# Patient Record
Sex: Female | Born: 1985 | Race: White | Hispanic: No | Marital: Married | Smoking: Former smoker
Health system: Southern US, Community
[De-identification: ages and names within clinical notes are randomized; demographics above are authoritative.]

## PROBLEM LIST (undated history)

## (undated) DIAGNOSIS — E559 Vitamin D deficiency, unspecified: Secondary | ICD-10-CM

## (undated) DIAGNOSIS — E538 Deficiency of other specified B group vitamins: Secondary | ICD-10-CM

## (undated) DIAGNOSIS — E063 Autoimmune thyroiditis: Secondary | ICD-10-CM

## (undated) HISTORY — DX: Deficiency of other specified B group vitamins: E53.8

## (undated) HISTORY — PX: WISDOM TOOTH EXTRACTION: SHX21

## (undated) HISTORY — DX: Vitamin D deficiency, unspecified: E55.9

---

## 2010-09-10 DIAGNOSIS — N921 Excessive and frequent menstruation with irregular cycle: Secondary | ICD-10-CM | POA: Insufficient documentation

## 2011-09-28 DIAGNOSIS — G619 Inflammatory polyneuropathy, unspecified: Secondary | ICD-10-CM | POA: Insufficient documentation

## 2011-12-15 DIAGNOSIS — E039 Hypothyroidism, unspecified: Secondary | ICD-10-CM | POA: Insufficient documentation

## 2014-09-09 DIAGNOSIS — E559 Vitamin D deficiency, unspecified: Secondary | ICD-10-CM | POA: Insufficient documentation

## 2014-11-19 DIAGNOSIS — Z789 Other specified health status: Secondary | ICD-10-CM | POA: Insufficient documentation

## 2015-01-28 DIAGNOSIS — K5900 Constipation, unspecified: Secondary | ICD-10-CM | POA: Insufficient documentation

## 2015-05-02 DIAGNOSIS — Z124 Encounter for screening for malignant neoplasm of cervix: Secondary | ICD-10-CM | POA: Insufficient documentation

## 2016-06-29 DIAGNOSIS — E063 Autoimmune thyroiditis: Secondary | ICD-10-CM | POA: Insufficient documentation

## 2016-09-28 DIAGNOSIS — J309 Allergic rhinitis, unspecified: Secondary | ICD-10-CM | POA: Insufficient documentation

## 2019-01-01 DIAGNOSIS — E042 Nontoxic multinodular goiter: Secondary | ICD-10-CM | POA: Insufficient documentation

## 2021-01-30 ENCOUNTER — Other Ambulatory Visit: Payer: Self-pay

## 2021-01-30 ENCOUNTER — Emergency Department
Admission: EM | Admit: 2021-01-30 | Discharge: 2021-01-30 | Disposition: A | Discharge status: Home / Self Care | Payer: BC Managed Care – PPO | Attending: Emergency Medicine | Admitting: Emergency Medicine

## 2021-01-30 ENCOUNTER — Emergency Department: Payer: BC Managed Care – PPO

## 2021-01-30 ENCOUNTER — Ambulatory Visit
Admission: RE | Admit: 2021-01-30 | Discharge: 2021-01-30 | Disposition: A | Discharge status: Home / Self Care | Payer: BC Managed Care – PPO | Source: Ambulatory Visit | Attending: Emergency Medicine | Admitting: Emergency Medicine

## 2021-01-30 ENCOUNTER — Encounter: Payer: Self-pay | Admitting: Emergency Medicine

## 2021-01-30 VITALS — BP 138/95 | HR 84 | Temp 99.3°F | Resp 16

## 2021-01-30 DIAGNOSIS — Z20822 Contact with and (suspected) exposure to covid-19: Secondary | ICD-10-CM | POA: Diagnosis not present

## 2021-01-30 DIAGNOSIS — R1030 Lower abdominal pain, unspecified: Secondary | ICD-10-CM

## 2021-01-30 DIAGNOSIS — Z87891 Personal history of nicotine dependence: Secondary | ICD-10-CM | POA: Diagnosis not present

## 2021-01-30 DIAGNOSIS — K5900 Constipation, unspecified: Secondary | ICD-10-CM | POA: Insufficient documentation

## 2021-01-30 DIAGNOSIS — R030 Elevated blood-pressure reading, without diagnosis of hypertension: Secondary | ICD-10-CM

## 2021-01-30 HISTORY — DX: Autoimmune thyroiditis: E06.3

## 2021-01-30 LAB — CBC
HCT: 41 % (ref 36.0–46.0)
Hemoglobin: 13.6 g/dL (ref 12.0–15.0)
MCH: 28.5 pg (ref 26.0–34.0)
MCHC: 33.2 g/dL (ref 30.0–36.0)
MCV: 85.8 fL (ref 80.0–100.0)
Platelets: 359 10*3/uL (ref 150–400)
RBC: 4.78 MIL/uL (ref 3.87–5.11)
RDW: 12.6 % (ref 11.5–15.5)
WBC: 9.2 10*3/uL (ref 4.0–10.5)
nRBC: 0 % (ref 0.0–0.2)

## 2021-01-30 LAB — COMPREHENSIVE METABOLIC PANEL
ALT: 9 U/L (ref 0–44)
AST: 18 U/L (ref 15–41)
Albumin: 4.5 g/dL (ref 3.5–5.0)
Alkaline Phosphatase: 37 U/L — ABNORMAL LOW (ref 38–126)
Anion gap: 7 (ref 5–15)
BUN: 10 mg/dL (ref 6–20)
CO2: 25 mmol/L (ref 22–32)
Calcium: 9.1 mg/dL (ref 8.9–10.3)
Chloride: 105 mmol/L (ref 98–111)
Creatinine, Ser: 0.72 mg/dL (ref 0.44–1.00)
GFR, Estimated: >60 mL/min (ref >60)
Glucose, Bld: 94 mg/dL (ref 70–99)
Potassium: 3.7 mmol/L (ref 3.5–5.1)
Sodium: 137 mmol/L (ref 135–145)
Total Bilirubin: 0.8 mg/dL (ref 0.3–1.2)
Total Protein: 7.9 g/dL (ref 6.5–8.1)

## 2021-01-30 LAB — RESP PANEL BY RT-PCR (FLU A&B, COVID) ARPGX2
Influenza A by PCR: NEGATIVE
Influenza B by PCR: NEGATIVE
SARS Coronavirus 2 by RT PCR: NEGATIVE

## 2021-01-30 LAB — POCT URINALYSIS DIP (MANUAL ENTRY)
Bilirubin, UA: NEGATIVE
Glucose, UA: NEGATIVE mg/dL
Ketones, POC UA: NEGATIVE mg/dL
Leukocytes, UA: NEGATIVE
Nitrite, UA: NEGATIVE
Protein Ur, POC: NEGATIVE mg/dL
Spec Grav, UA: 1.025 (ref 1.010–1.025)
Urobilinogen, UA: 0.2 E.U./dL
pH, UA: 7 (ref 5.0–8.0)

## 2021-01-30 LAB — LIPASE, BLOOD: Lipase: 27 U/L (ref 11–51)

## 2021-01-30 LAB — POCT URINE PREGNANCY: Preg Test, Ur: NEGATIVE

## 2021-01-30 IMAGING — CT CT ABD-PELV W/ CM
2 of 4 series · 17 of 46 positions shown, 19 images · IV contrast (omnipaque)
Comparison: None.

CLINICAL DATA: Right lower quadrant abdominal pain and bloating.

EXAM:
CT ABDOMEN AND PELVIS WITH CONTRAST
TECHNIQUE: Multidetector CT imaging of the abdomen and pelvis was performed
using the standard protocol following bolus administration of
intravenous contrast.
CONTRAST:  100mL OMNIPAQUE IOHEXOL 300 MG/ML  SOLN

[Series 2: routine abd/pel with · axial · 0.76mm/px · z∈[-893,-488]mm · 14 of 89 slices shown, 16 images]
[im 4/89  soft-tissue]
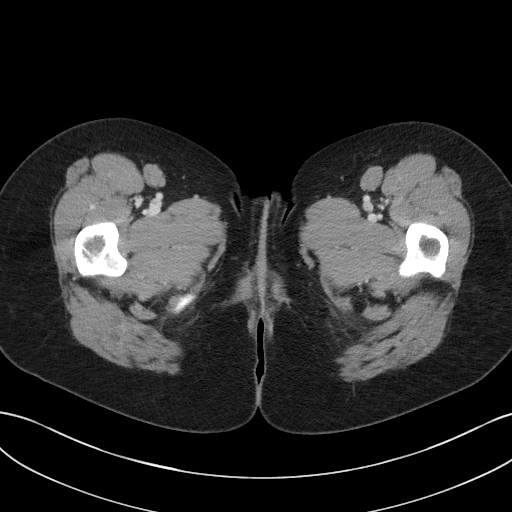
[im 4/89  bone]
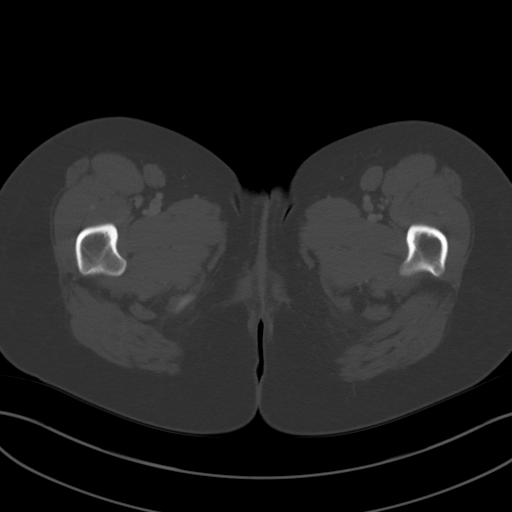
[im 11/89  soft-tissue]
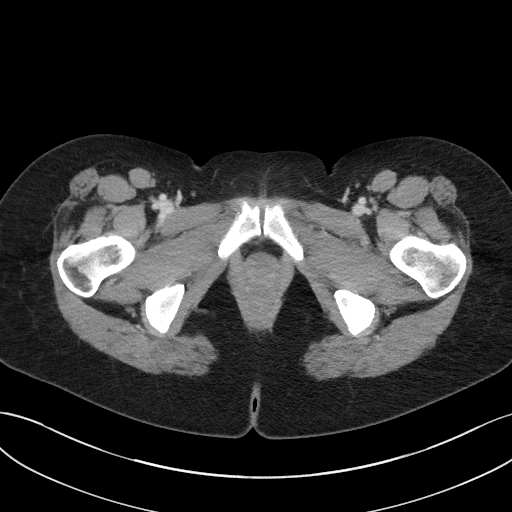
[im 18/89  soft-tissue]
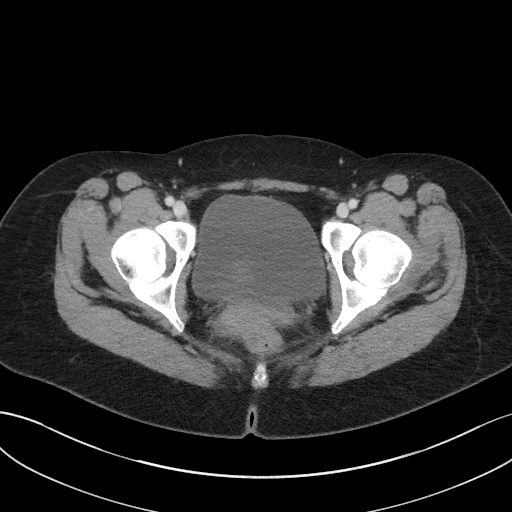
[im 25/89  soft-tissue]
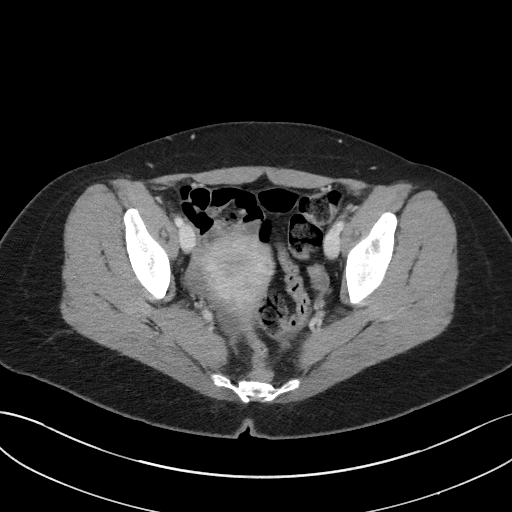
[im 29/89  soft-tissue]
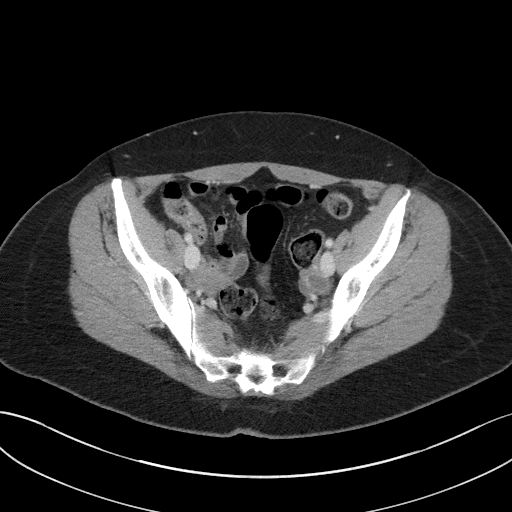
[im 36/89  soft-tissue]
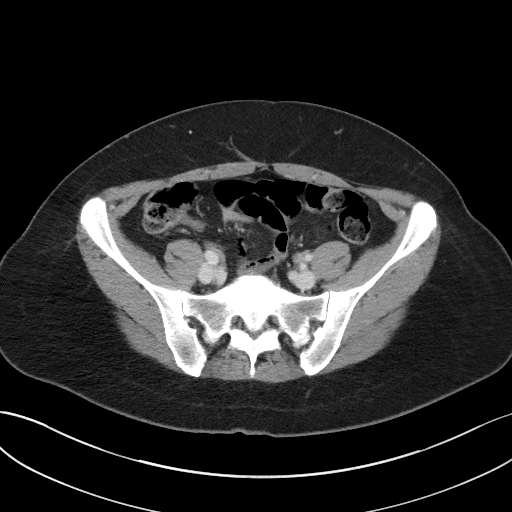
[im 43/89  soft-tissue]
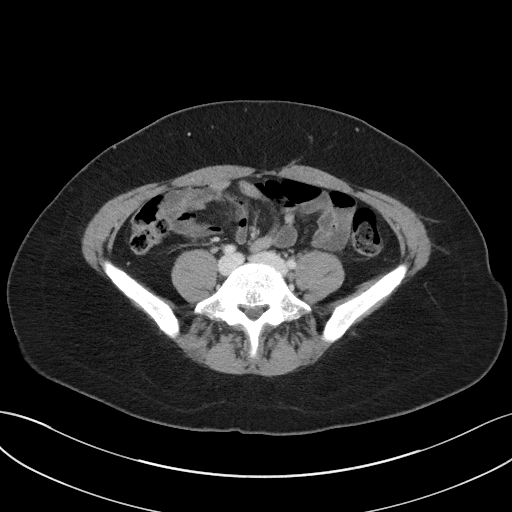
[im 46/89  soft-tissue]
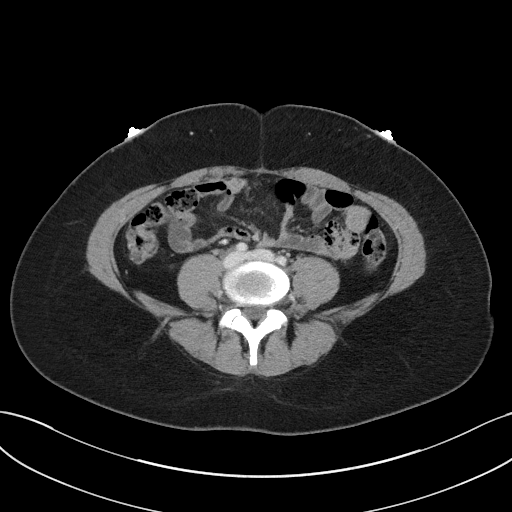
[im 53/89  soft-tissue]
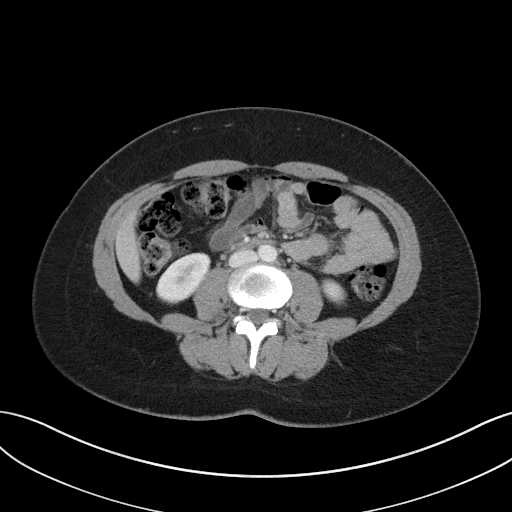
[im 53/89  bone]
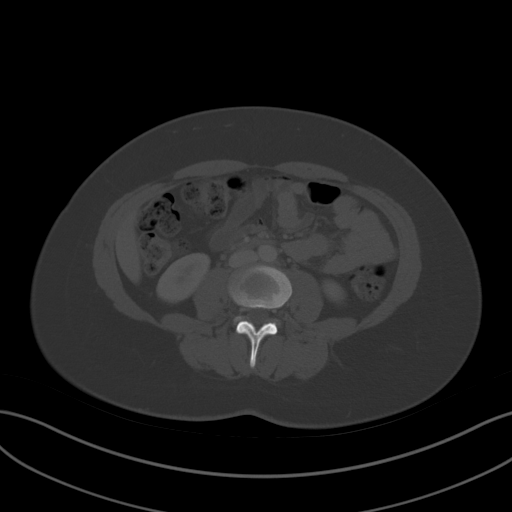
[im 60/89  soft-tissue]
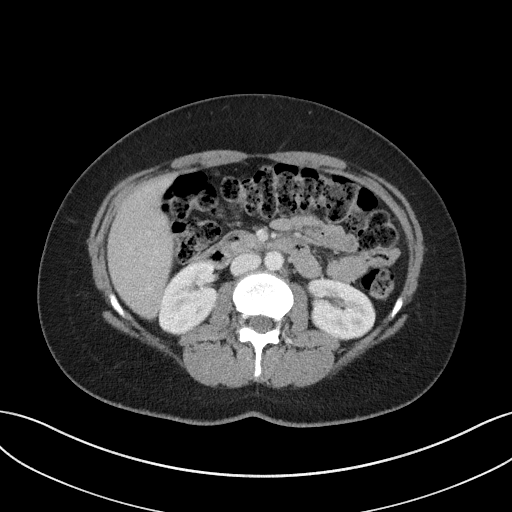
[im 67/89  soft-tissue]
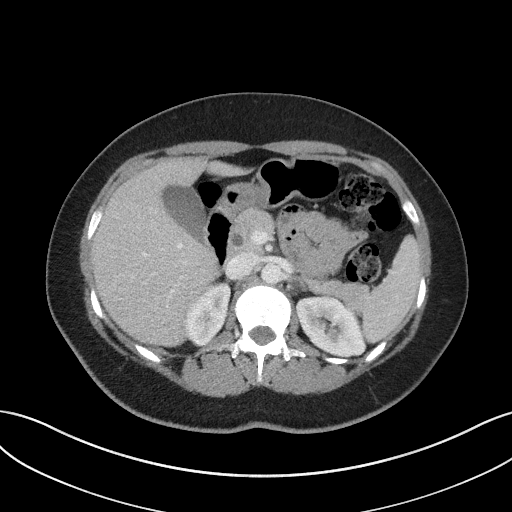
[im 71/89  soft-tissue]
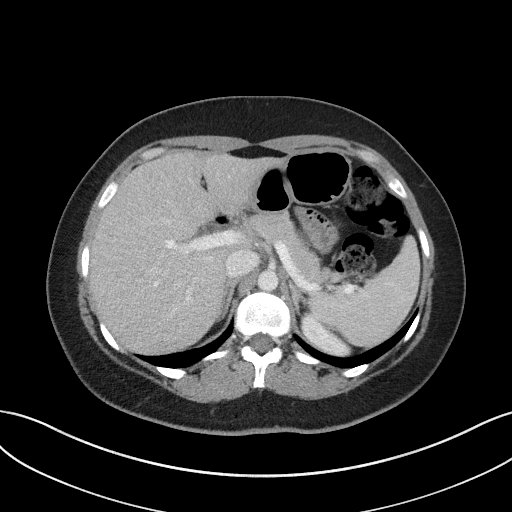
[im 78/89  soft-tissue]
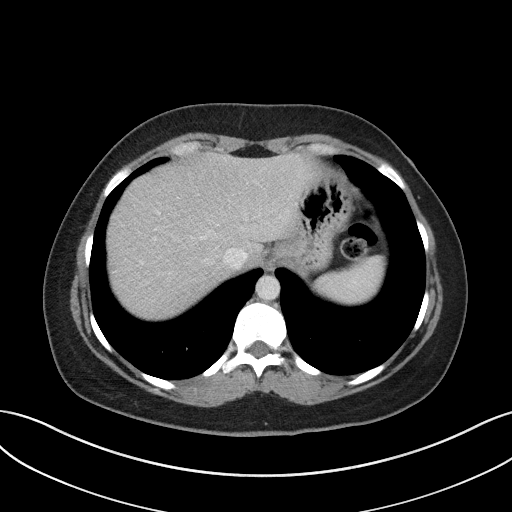
[im 85/89  soft-tissue]
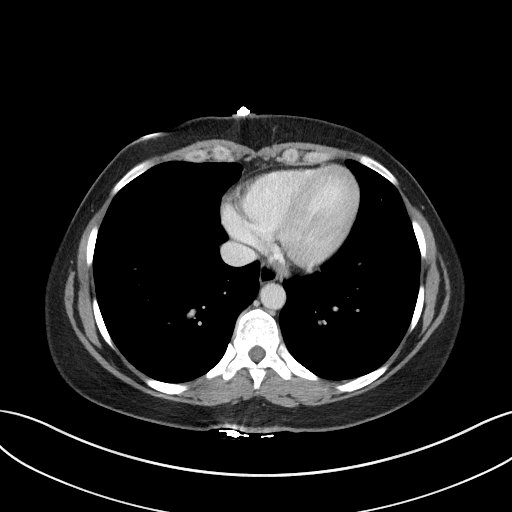

[Series 5: coronal st · coronal · 0.85mm/px · 3 of 82 slices shown]
[im 28/82  soft-tissue]
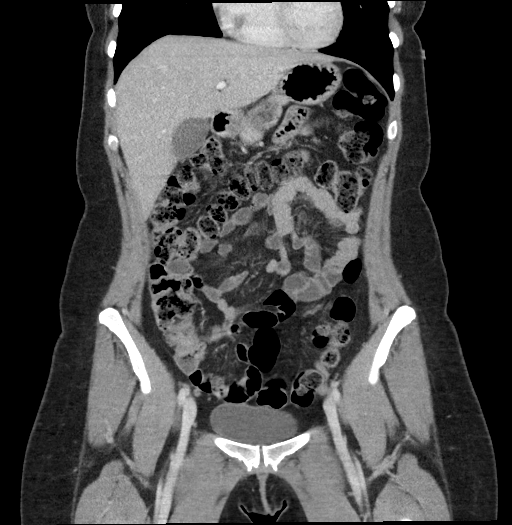
[im 37/82  soft-tissue]
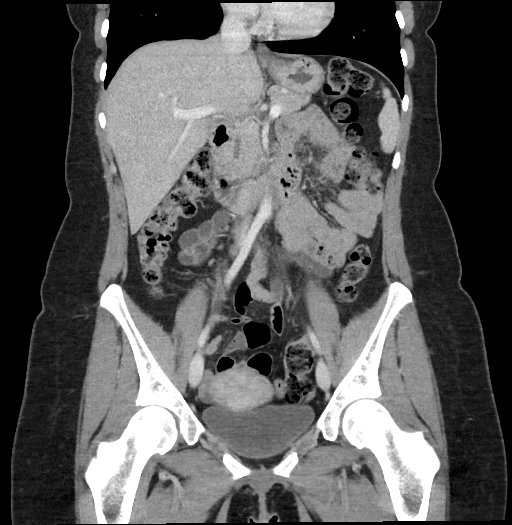
[im 46/82  soft-tissue]
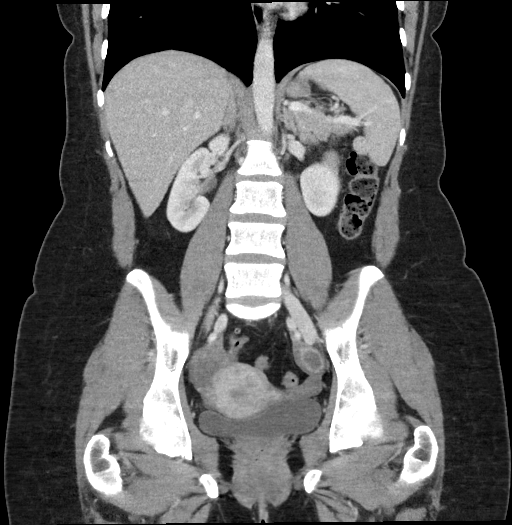

[17 of 46 positions shown; findings below may reference images not displayed]

FINDINGS: Lower chest: Unremarkable

Hepatobiliary: Mildly dilated common bile duct 0.7 cm in diameter.
No significant intrahepatic biliary duct dilatation. No sharply
defined calcification along the biliary tree. No focal hepatic
parenchymal lesion identified.

Pancreas: Unremarkable.  No pancreatic duct dilatation.

Spleen: Unremarkable

Adrenals/Urinary Tract: Sharply defined 0.8 cm hypodense lesion of
the left kidney lower pole is likely a cyst although technically too
small to characterize. Adrenal glands unremarkable. No
hydronephrosis, hydroureter, or urinary tract calculi identified.

Stomach/Bowel: Mild prominence of stool in the colon favoring mild
constipation. Normal appendix. No dilated small bowel.

Vascular/Lymphatic: Unremarkable

Reproductive: 1.6 by 1.1 cm rim enhancing lesion in the left ovary
is likely a corpus luteum. Endometrial thickness approximately
cm, within normal limits for patient age. Trace free pelvic fluid,
possibly physiologic but technically nonspecific.

Other: No supplemental non-categorized findings.

Musculoskeletal: Unremarkable
IMPRESSION: 1. Mild extrahepatic biliary dilatation, cause uncertain.
2. Mild prominence of stool in the colon favoring mild constipation.
3. Trace free pelvic fluid, possibly physiologic.

## 2021-01-30 MED ORDER — IOHEXOL 300 MG/ML  SOLN
100.0000 mL | Freq: Once | INTRAMUSCULAR | Status: AC | PRN
  Administered 2021-01-30: 100 mL via INTRAVENOUS

## 2021-01-30 NOTE — ED Triage Notes (Addendum)
Pt reports that she has abd pain in her lower quads bilat with bloating with pain between her shoulder blades and back pain. She went to urgent care and they did a ua on her. She reports nausea with dizziness. Denies diarrhea.

## 2021-01-30 NOTE — ED Provider Notes (Signed)
[facility]    CSN: [id number] Arrival date & time: 01/30/21  1356      History   Chief Complaint Chief Complaint  Patient presents with   Abdominal Pain     HPI Angela Guerrero is a 35 y.o. female.  Patient presents with 3 to 4-day history of acute lower abdominal pain.  No falls or trauma.  The pain is constant and comes in waves, currently 4/10, dull cramping aching.  Patient states she is concerned for ischemic bowel.  She denies fever, chills, vomiting, diarrhea, constipation, dysuria, hematuria, vaginal discharge, pelvic pain, or other symptoms.  No treatments attempted at home.  Her medical history includes Hashimoto's thyroiditis.  The history is provided by the patient.   Past Medical History:  Diagnosis Date   Hashimoto's thyroiditis     There are no problems to display for this patient.   Past Surgical History:  Procedure Laterality Date   WISDOM TOOTH EXTRACTION      OB History   No obstetric history on file.      Home Medications    Prior to Admission medications   Medication Sig Start Date End Date Taking? Authorizing Provider  levothyroxine (SYNTHROID) 112 MCG tablet Take 112 mcg by mouth daily before breakfast.   Yes [provider]    Family History History reviewed. No pertinent family history.  Social History Social History   Tobacco Use   Smoking status: Former    Years: 15.00    Pack years: 0.00    Types: Cigarettes    Quit date: 12/31/2020    Years since quitting: 0.0   Smokeless tobacco: Never  Vaping Use   Vaping Use: Never used  Substance Use Topics   Alcohol use: Yes    Comment: 3/wk   Drug use: Never     Allergies   Percocet [oxycodone-acetaminophen]   Review of Systems Review of Systems  Constitutional:  Negative for chills and fever.  Respiratory:  Negative for cough and shortness of breath.   Cardiovascular:  Negative for chest pain and palpitations.  Gastrointestinal:  Positive for abdominal  pain. Negative for diarrhea, nausea and vomiting.  Genitourinary:  Negative for dysuria, flank pain, hematuria, pelvic pain and vaginal discharge.  Skin:  Negative for color change and rash.  All other systems reviewed and are negative.   Physical Exam Triage Vital Signs ED Triage Vitals  Enc Vitals Group     BP      Pulse      Resp      Temp      Temp src      SpO2      Weight      Height      Head Circumference      Peak Flow      Pain Score      Pain Loc      Pain Edu?      Excl. in GC?    No data found.  Updated Vital Signs BP (!) 138/95 (BP Location: Left Arm)   Pulse 84   Temp 99.3 F (37.4 C) (Oral)   Resp 16   LMP 01/09/2021 (Exact Date)   SpO2 98%   Visual Acuity Right Eye Distance:   Left Eye Distance:   Bilateral Distance:    Right Eye Near:   Left Eye Near:    Bilateral Near:     Physical Exam Vitals and nursing note reviewed.  Constitutional:      General:  She is not in acute distress.    Appearance: She is well-developed. She is not ill-appearing.  HENT:     Head: Normocephalic and atraumatic.     Mouth/Throat:     Mouth: Mucous membranes are moist.  Eyes:     Conjunctiva/sclera: Conjunctivae normal.  Cardiovascular:     Rate and Rhythm: Normal rate and regular rhythm.     Heart sounds: Normal heart sounds.  Pulmonary:     Effort: Pulmonary effort is normal. No respiratory distress.     Breath sounds: Normal breath sounds.  Abdominal:     General: Bowel sounds are normal. There is no distension.     Palpations: Abdomen is soft.     Tenderness: There is abdominal tenderness in the suprapubic area. There is no right CVA tenderness, left CVA tenderness, guarding or rebound.     Comments: Mild tenderness to palpation of suprapubic area.  No rebound or guarding.  No CVAT.  Musculoskeletal:     Cervical back: Neck supple.  Skin:    General: Skin is warm and dry.  Neurological:     General: No focal deficit present.     Mental Status:  She is alert and oriented to person, place, and time.     Gait: Gait normal.  Psychiatric:        Mood and Affect: Mood normal.        Behavior: Behavior normal.     UC Treatments / Results  Labs (all labs ordered are listed, but only abnormal results are displayed) Labs Reviewed  POCT URINALYSIS DIP (MANUAL ENTRY) - Abnormal; Notable for the following components:      Result Value   Blood, UA trace-intact (*)    All other components within normal limits  POCT URINE PREGNANCY    EKG   Radiology No results found.  Procedures Procedures (including critical care time)  Medications Ordered in UC Medications - No data to display  Initial Impression / Assessment and Plan / UC Course  I have reviewed the triage vital signs and the nursing notes.  Pertinent labs & imaging results that were available during my care of the patient were reviewed by me and considered in my medical decision making (see chart for details).  Lower abdominal pain, elevated blood pressure reading.  Patient is mildly tender in her suprapubic area.  She is afebrile and her vital signs are stable.  She expresses concern that she may have ischemic bowel.  No history of bowel disease.  Discussed limitations of evaluation in an urgent care setting.  Sending her to the ED for evaluation.  Also discussed that her blood pressure is elevated today and needs to be rechecked by her PCP in 2 to 4 weeks.  She agrees to plan of care.   Final Clinical Impressions(s) / UC Diagnoses   Final diagnoses:  Lower abdominal pain  Elevated blood pressure reading     Discharge Instructions      Go to the emergency department for evaluation of your abdominal pain.  Your blood pressure is elevated today at 138/95.  Please have this rechecked by your primary care provider in 2-4 weeks.          ED Prescriptions   None    PDMP not reviewed this encounter.   Mickie Bail, NP 01/30/21 1451

## 2021-01-30 NOTE — ED Notes (Signed)
Patient resting in room 3.  VSS.  Respirations even and unlabored.  No acute distress noted.

## 2021-01-30 NOTE — Discharge Instructions (Addendum)
Go to the emergency department for evaluation of your abdominal pain.  Your blood pressure is elevated today at 138/95.  Please have this rechecked by your primary care provider in 2-4 weeks.

## 2021-01-30 NOTE — ED Provider Notes (Signed)
[facility] Emergency Department Provider Note  Time seen: 6:44 PM  I have reviewed the triage vital signs and the nursing notes.   HISTORY  Chief Complaint Abdominal Pain   HPI Angela Guerrero is a 35 y.o. female with no significant past medical history presents to the emergency department for lower abdominal pain.  According to the patient for the past 3 days she has been experiencing more vague abdominal pain moderate in severity now more concentrated pain in the lower abdomen.  Patient denies any dysuria or hematuria.  Denies any vaginal bleeding or discharge.  Patient is married and denies any possibility of STD.  Patient states subjective fever over the past 2 days as well but is never measured a temperature.  Denies any vomiting or diarrhea.   Past Medical History:  Diagnosis Date   Hashimoto's thyroiditis     There are no problems to display for this patient.   Past Surgical History:  Procedure Laterality Date   WISDOM TOOTH EXTRACTION      Prior to Admission medications   Medication Sig Start Date End Date Taking? Authorizing Provider  levothyroxine (SYNTHROID) 112 MCG tablet Take 112 mcg by mouth daily before breakfast.    [provider]    Allergies  Allergen Reactions   Percocet [Oxycodone-Acetaminophen] Diarrhea    No family history on file.  Social History Social History   Tobacco Use   Smoking status: Former    Years: 15.00    Pack years: 0.00    Types: Cigarettes    Quit date: 12/31/2020    Years since quitting: 0.0   Smokeless tobacco: Never  Vaping Use   Vaping Use: Never used  Substance Use Topics   Alcohol use: Yes    Comment: 3/wk   Drug use: Never    Review of Systems Constitutional: Subjective fever Cardiovascular: Negative for chest pain. Respiratory: Negative for shortness of breath. Gastrointestinal: Moderate central lower abdominal pain. Genitourinary: Negative for urinary  compaints Musculoskeletal: Negative for musculoskeletal complaints Skin: Negative for skin complaints  Neurological: Negative for headache All other ROS negative  ____________________________________________   PHYSICAL EXAM:  VITAL SIGNS: ED Triage Vitals  Enc Vitals Group     BP 01/30/21 1828 (!) 144/90     Pulse Rate 01/30/21 1828 71     Resp 01/30/21 1828 17     Temp 01/30/21 1828 98.3 F (36.8 C)     Temp Source 01/30/21 1828 Oral     SpO2 01/30/21 1828 99 %     Weight 01/30/21 1535 160 lb (72.6 kg)     Height 01/30/21 1535 5\' 2"  (1.575 m)     Head Circumference --      Peak Flow --      Pain Score 01/30/21 1534 4     Pain Loc --      Pain Edu? --      Excl. in GC? --    Constitutional: Alert and oriented. Well appearing and in no distress. Eyes: Normal exam ENT      Head: Normocephalic and atraumatic.      Mouth/Throat: Mucous membranes are moist. Cardiovascular: Normal rate, regular rhythm.  Respiratory: Normal respiratory effort without tachypnea nor retractions. Breath sounds are clear Gastrointestinal: Soft, mild suprapubic tenderness palpation without rebound guarding or distention.  Abdomen otherwise benign. Musculoskeletal: Nontender with normal range of motion in all extremities. Neurologic:  Normal speech and language. No gross focal neurologic deficits  Skin:  Skin is warm, dry  and intact.  Psychiatric: Mood and affect are normal. S  ____________________________________________    EKG  EKG viewed and interpreted by myself shows a normal sinus rhythm at 93 bpm with a narrow QRS, normal axis, normal intervals, no concerning ST changes.  ____________________________________________    RADIOLOGY  Mildly dilated biliary duct otherwise negative CT  ____________________________________________   INITIAL IMPRESSION / ASSESSMENT AND PLAN / ED COURSE  Pertinent labs & imaging results that were available during my care of the patient were reviewed by  me and considered in my medical decision making (see chart for details).   Patient presents to the emergency department for lower abdominal pain.  Patient states she has been experiencing pain over the past 3 days initially more vague across the abdomen and now more focal to the lower abdomen.  Patient states subjective fever at times as well.  Patient's lab work is largely within normal limits.  Urinalysis performed at urgent care and [facility] was negative.  We will proceed with CT scan to rule out appendicitis or other intra-abdominal pathology.  Differential would include appendicitis, ovarian cyst or hemorrhagic cyst, diverticulitis.  CT largely negative besides mild constipation.  Normal appendix.  Given the patient's reassuring work-up and negative CT I do believe the patient safe for discharge home.  I did discuss with the patient trial of MiraLAX.  Angela Guerrero was evaluated in Emergency Department on 01/30/2021 for the symptoms described in the history of present illness. She was evaluated in the context of the global COVID-19 pandemic, which necessitated consideration that the patient might be at risk for infection with the SARS-CoV-2 virus that causes COVID-19. Institutional protocols and algorithms that pertain to the evaluation of patients at risk for COVID-19 are in a state of rapid change based on information released by regulatory bodies including the CDC and federal and state organizations. These policies and algorithms were followed during the patient's care in the ED.  ____________________________________________   FINAL CLINICAL IMPRESSION(S) / ED DIAGNOSES  Lower abdominal pain   Minna Antis, MD 01/30/21 2015

## 2021-01-30 NOTE — Discharge Instructions (Addendum)
As we discussed your work-up is overall reassuring besides mild constipation.  Please use MiraLAX or Colace (over-the-counter) as written on the box.  Return to the emergency department for any fever, worsening abdominal pain, or any other symptom personally concerning to yourself.

## 2021-01-30 NOTE — ED Triage Notes (Signed)
Pt presents with abdominal pain, bloating, pain in between shoulder blades intermittent x years but has become constant and going to mid back.  Describes abdominal pain as dull, cramping pain that won't go away.  Was initially generalized abdominal pain now is lower. No changes in bowel movement.   Has not taken any meds at home.

## 2022-05-30 ENCOUNTER — Ambulatory Visit
Admission: RE | Admit: 2022-05-30 | Discharge: 2022-05-30 | Disposition: A | Discharge status: Home / Self Care | Payer: BC Managed Care – PPO | Source: Ambulatory Visit | Attending: Internal Medicine | Admitting: Internal Medicine

## 2022-05-30 VITALS — BP 136/92 | HR 91 | Temp 97.9°F | Resp 16

## 2022-05-30 DIAGNOSIS — J069 Acute upper respiratory infection, unspecified: Secondary | ICD-10-CM | POA: Insufficient documentation

## 2022-05-30 DIAGNOSIS — R519 Headache, unspecified: Secondary | ICD-10-CM | POA: Diagnosis not present

## 2022-05-30 DIAGNOSIS — Z1152 Encounter for screening for COVID-19: Secondary | ICD-10-CM | POA: Diagnosis not present

## 2022-05-30 LAB — RESP PANEL BY RT-PCR (RSV, FLU A&B, COVID)  RVPGX2
Influenza A by PCR: NEGATIVE
Influenza B by PCR: NEGATIVE
Resp Syncytial Virus by PCR: NEGATIVE
SARS Coronavirus 2 by RT PCR: NEGATIVE

## 2022-05-30 MED ORDER — DEXAMETHASONE SODIUM PHOSPHATE 10 MG/ML IJ SOLN
10.0000 mg | Freq: Once | INTRAMUSCULAR | Status: AC
  Administered 2022-05-30: 10 mg via INTRAMUSCULAR

## 2022-05-30 MED ORDER — KETOROLAC TROMETHAMINE 30 MG/ML IJ SOLN
30.0000 mg | Freq: Once | INTRAMUSCULAR | Status: AC
  Administered 2022-05-30: 30 mg via INTRAMUSCULAR

## 2022-05-30 NOTE — Discharge Instructions (Signed)
It appears that you have a viral upper respiratory infection that should self resolve with symptomatic treatment.  Your COVID test is pending.  We will call if it is positive.  I have given you 2 injections today to help alleviate headache and upper respiratory symptoms.  Please avoid taking any ibuprofen, advil, aleve for at least 24 hours following injection.

## 2022-05-30 NOTE — ED Provider Notes (Signed)
[facility]    CSN: [id number] Arrival date & time: 05/30/22  1309      History   Chief Complaint Chief Complaint  Patient presents with   Nasal Congestion    Day 5 of headache, sore throat, congestion etc. Would like to rule out influenza/covid prior to returning to work - Entered by patient   URI    HPI Angela Guerrero is a 36 y.o. female.   Patient presents with nasal congestion, sinus pressure, headache that started about 6 days ago.  Her husband currently has a cough.  She denies any known fevers at home.  Headache is present in the left side of the head.  Patient is attributing to headache to sinus pressure.  Denies any r recent falls or trauma to the head.  She does report history of migraines in childhood.  Denies cough, chest pain, shortness of breath, sore throat, ear pain, nausea, vomiting, diarrhea, abdominal pain.  Patient has taken over-the-counter medications for cold and flu with minimal improvement of symptoms.  Patient has taken ibuprofen early this morning with no improvement in headache.   URI   Past Medical History:  Diagnosis Date   Hashimoto's thyroiditis     Patient Active Problem List   Diagnosis Date Noted   Multinodular goiter 01/01/2019   Allergic rhinitis 09/28/2016   Hashimoto's thyroiditis 06/29/2016   Encounter for screening for malignant neoplasm of cervix 05/02/2015   Constipation 01/28/2015   Other specified health status 11/19/2014   Vitamin D deficiency 09/09/2014   Hypothyroidism 12/15/2011   Inflammatory polyneuropathy ([place]) 09/28/2011   Excessive and frequent menstruation with irregular cycle 09/10/2010    Past Surgical History:  Procedure Laterality Date   WISDOM TOOTH EXTRACTION      OB History   No obstetric history on file.      Home Medications    Prior to Admission medications   Medication Sig Start Date End Date Taking? Authorizing Provider  levothyroxine (SYNTHROID) 112 MCG tablet Take by mouth.  12/30/11  Yes [provider]  levothyroxine (SYNTHROID) 112 MCG tablet Take 112 mcg by mouth daily before breakfast.    [provider]    Family History History reviewed. No pertinent family history.  Social History Social History   Tobacco Use   Smoking status: Former    Years: 15.00    Types: Cigarettes    Quit date: 12/31/2020    Years since quitting: 1.4   Smokeless tobacco: Never  Vaping Use   Vaping Use: Never used  Substance Use Topics   Alcohol use: Yes    Comment: 3/wk   Drug use: Never     Allergies   Percocet [oxycodone-acetaminophen]   Review of Systems Review of Systems Per HPI  Physical Exam Triage Vital Signs ED Triage Vitals [05/30/22 1345]  Enc Vitals Group     BP (!) 136/92     Pulse Rate 91     Resp 16     Temp 97.9 F (36.6 C)     Temp src      SpO2 98 %     Weight      Height      Head Circumference      Peak Flow      Pain Score 0     Pain Loc      Pain Edu?      Excl. in Loop?    No data found.  Updated Vital Signs BP (!) 136/92   Pulse  91   Temp 97.9 F (36.6 C)   Resp 16   LMP 05/24/2022 (Approximate)   SpO2 98%   Visual Acuity Right Eye Distance:   Left Eye Distance:   Bilateral Distance:    Right Eye Near:   Left Eye Near:    Bilateral Near:     Physical Exam Constitutional:      General: She is not in acute distress.    Appearance: Normal appearance. She is not toxic-appearing or diaphoretic.  HENT:     Head: Normocephalic and atraumatic.     Right Ear: Ear canal normal. No drainage, swelling or tenderness. A middle ear effusion is present. There is no impacted cerumen. No foreign body. No mastoid tenderness. Tympanic membrane is bulging. Tympanic membrane is not perforated or erythematous.     Left Ear: Ear canal normal. No drainage, swelling or tenderness. A middle ear effusion is present. There is no impacted cerumen. No foreign body. No mastoid tenderness. Tympanic membrane is bulging.  Tympanic membrane is not perforated or erythematous.     Nose: Congestion present.     Mouth/Throat:     Mouth: Mucous membranes are moist.     Pharynx: No posterior oropharyngeal erythema.  Eyes:     Extraocular Movements: Extraocular movements intact.     Conjunctiva/sclera: Conjunctivae normal.     Pupils: Pupils are equal, round, and reactive to light.  Cardiovascular:     Rate and Rhythm: Normal rate and regular rhythm.     Pulses: Normal pulses.     Heart sounds: Normal heart sounds.  Pulmonary:     Effort: Pulmonary effort is normal. No respiratory distress.     Breath sounds: Normal breath sounds. No stridor. No wheezing, rhonchi or rales.  Abdominal:     General: Abdomen is flat. Bowel sounds are normal.     Palpations: Abdomen is soft.  Musculoskeletal:        General: Normal range of motion.     Cervical back: Normal range of motion.  Skin:    General: Skin is warm and dry.  Neurological:     General: No focal deficit present.     Mental Status: She is alert and oriented to person, place, and time. Mental status is at baseline.     Cranial Nerves: Cranial nerves 2-12 are intact.     Sensory: Sensation is intact.     Motor: Motor function is intact.     Coordination: Coordination is intact.     Gait: Gait is intact.  Psychiatric:        Mood and Affect: Mood normal.        Behavior: Behavior normal.      UC Treatments / Results  Labs (all labs ordered are listed, but only abnormal results are displayed) Labs Reviewed  RESP PANEL BY RT-PCR (RSV, FLU A&B, COVID)  [id number]    EKG   Radiology No results found.  Procedures Procedures (including critical care time)  Medications Ordered in UC Medications  ketorolac (TORADOL) 30 MG/ML injection 30 mg (30 mg Intramuscular Given 05/30/22 1419)  dexamethasone (DECADRON) injection 10 mg (10 mg Intramuscular Given 05/30/22 1419)    Initial Impression / Assessment and Plan / UC Course  I have reviewed the  triage vital signs and the nursing notes.  Pertinent labs & imaging results that were available during my care of the patient were reviewed by me and considered in my medical decision making (see chart for details).  Patient presents with symptoms likely from a viral upper respiratory infection. Differential includes bacterial pneumonia, sinusitis, allergic rhinitis, Covid 19, flu, RSV. Do not suspect underlying cardiopulmonary process. Symptoms seem unlikely related to ACS, CHF or COPD exacerbations, pneumonia, pneumothorax. Patient is nontoxic appearing and not in need of emergent medical intervention.  Patient requesting testing for COVID, flu, RSV so PCR is pending.  Recommended symptom control with over the counter medications.  I do think that patient would benefit from IM Toradol and Decadron to help alleviate headache and upper respiratory symptoms with sinus pressure.  Advised to not take any additional NSAIDs for least 24 hours following injection.  Return if symptoms fail to improve in 1-2 weeks or you develop shortness of breath, chest pain, severe headache. Patient states understanding and is agreeable.  Discharged with PCP followup.  Final Clinical Impressions(s) / UC Diagnoses   Final diagnoses:  Viral upper respiratory infection  Acute nonintractable headache, unspecified headache type     Discharge Instructions      It appears that you have a viral upper respiratory infection that should self resolve with symptomatic treatment.  Your COVID test is pending.  We will call if it is positive.  I have given you 2 injections today to help alleviate headache and upper respiratory symptoms.  Please avoid taking any ibuprofen, advil, aleve for at least 24 hours following injection.     ED Prescriptions   None    PDMP not reviewed this encounter.   Teodora Medici, [place] 05/30/22 1531

## 2022-05-30 NOTE — ED Triage Notes (Signed)
Pt. Present to UC w/ c/o URI symptoms since Tuesday.

## 2022-06-03 ENCOUNTER — Encounter: Payer: Self-pay | Admitting: Family

## 2022-06-03 ENCOUNTER — Ambulatory Visit: Payer: BC Managed Care – PPO | Admitting: Family

## 2022-06-03 VITALS — BP 134/84 | HR 92 | Temp 98.4°F | Resp 16 | Ht 63.75 in | Wt 172.2 lb

## 2022-06-03 DIAGNOSIS — R5383 Other fatigue: Secondary | ICD-10-CM | POA: Diagnosis not present

## 2022-06-03 DIAGNOSIS — J069 Acute upper respiratory infection, unspecified: Secondary | ICD-10-CM | POA: Insufficient documentation

## 2022-06-03 DIAGNOSIS — E559 Vitamin D deficiency, unspecified: Secondary | ICD-10-CM

## 2022-06-03 DIAGNOSIS — E039 Hypothyroidism, unspecified: Secondary | ICD-10-CM

## 2022-06-03 DIAGNOSIS — E042 Nontoxic multinodular goiter: Secondary | ICD-10-CM

## 2022-06-03 DIAGNOSIS — Z23 Encounter for immunization: Secondary | ICD-10-CM | POA: Diagnosis not present

## 2022-06-03 MED ORDER — SYNTHROID 125 MCG PO TABS: 125.0000 ug | ORAL_TABLET | Freq: Every day | ORAL | Status: DC

## 2022-06-03 NOTE — Assessment & Plan Note (Signed)
Advised patient on supportive measures:  Be sure to rest, drink plenty of fluids, and use tylenol or ibuprofen as needed for pain. Follow up if fever >101, if symptoms worsen or if symptoms are not improved in 3 days. Patient verbalizes understanding.   

## 2022-06-03 NOTE — Assessment & Plan Note (Signed)
Will attempt to obtain h/o thyroid u/s  May need to continue annual u/s

## 2022-06-03 NOTE — Progress Notes (Signed)
New Patient Office Visit  Subjective:  Patient ID: Angela Guerrero, female    DOB: 1986/03/10  Age: 36 y.o. MRN: [id number]  CC:  Chief Complaint  Patient presents with   Establish Care    HPI [name] is here to establish care as a new patient.  Prior provider was: Dr. Melchor Amour, at [facility].  Pt is without acute concerns.  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 Sometimes light flu for 8-9 days and or heavy for 2-3 days the next cycle. Prior to this was irregular with heavy periods, tried birth control in the past but didn't do well.   TD: 05/16/2018   Flu vaccine: is wanting to get flu vaccine today.  Pap: 2020 , negative per pt Covid vaccine: declines  C/o nasal congestion, post nasal drip, been going on for three days.   chronic concerns:  Hypothyroid: brand name synthroid , recently increased to 125 mcg brand name in march 2023. Has not yet been rechecked. Had repeat u/s last year 2022, pt states was stable appearing.   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 [place] not had any recent episodes [place]   Constipation: typically comes and goes. Typically for her to go every 1-3 days.   Past Medical History:  Diagnosis Date   Hashimoto's thyroiditis     Past Surgical History:  Procedure Laterality Date   WISDOM TOOTH EXTRACTION      Family History  Problem Relation Age of Onset   Early death Mother    Alcohol abuse Mother    Depression Mother        died age 65   Anxiety disorder Mother    Mental illness Mother    Hypertension Father    Hyperlipidemia Father    Early death Father    COPD Father    Alcohol abuse Father    Heart disease Father    Heart attack Father        died at age 26   Bipolar disorder Brother    Drug abuse Brother    Polycystic kidney disease Brother    Pulmonary  Hypertension Brother    Hypertension Brother    Hypertension Maternal Grandmother    Diabetes Maternal Grandmother    Pancreatic cancer Maternal Grandmother    Hyperlipidemia Maternal Grandfather    Heart disease Maternal Grandfather    Hypertension Maternal Grandfather    Melanoma Maternal Grandfather    COPD Maternal Grandfather    Hypertension Paternal Grandmother    Hyperlipidemia Paternal Grandmother    Heart disease Paternal Grandmother    Hypothyroidism Paternal Grandmother    Hypertension Paternal Grandfather    Hyperlipidemia Paternal Grandfather    Early death Paternal Grandfather    Heart disease Paternal Grandfather    Hypothyroidism Half-Sister     Social History   Socioeconomic History   Marital status: Married    Spouse name: Not on file   Number of children: 2   Years of education: Not on file   Highest education level: Not on file  Occupational History   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 [employer]    Employer: [employer]  Tobacco Use   Smoking status: Former    Years: 15.00    Types: Cigarettes    Quit date: 12/31/2020    Years since quitting: 1.4   Smokeless tobacco: Never  Vaping Use   Vaping Use: Never used  Substance and Sexual Activity   Alcohol use: Yes    Comment: 3/wk   Drug use: Never   Sexual activity: Yes    Partners: Male    Birth control/protection: Condom  Other Topics Concern   Not on file  Social History Narrative   58  y/o daughter    Son is 83   Social Determinants of Corporate investment banker Strain: Not on file  Food Insecurity: Not on file  Transportation Needs: Not on file  Physical Activity: Not on file  Stress: Not on file  Social Connections: Not on file  Intimate Partner Violence: Not on file    Outpatient Medications Prior to Visit  Medication Sig Dispense Refill   levothyroxine (SYNTHROID) 112 MCG tablet Take 112 mcg by mouth daily before breakfast.     levothyroxine (SYNTHROID) 112 MCG tablet Take by mouth.      SYNTHROID 125 MCG tablet Take 125 mcg by mouth daily before breakfast.     No facility-administered medications prior to visit.    Allergies  Allergen Reactions   Percocet [Oxycodone-Acetaminophen] Diarrhea    ROS Review of Systems  Constitutional:  Negative for chills and fatigue.  HENT:  Positive for congestion and postnasal drip.   Respiratory:  Negative for cough and shortness of breath.   Cardiovascular:  Negative for chest pain and leg swelling.  Gastrointestinal:  Positive for constipation (chronic). Negative for diarrhea and nausea.  Genitourinary:  Negative for difficulty urinating, menstrual problem and vaginal bleeding.  Neurological:  Negative for dizziness and headaches.  Psychiatric/Behavioral:  Negative for agitation and sleep disturbance.   All other systems reviewed and are negative.       Objective:    Physical Exam Vitals reviewed.  Constitutional:      General: She is not in acute distress.    Appearance: Normal appearance. She is obese. She is not ill-appearing or toxic-appearing.  HENT:     Right Ear: Tympanic membrane normal.     Left Ear: A middle ear effusion (clear) is present.     Nose: Congestion present.     Mouth/Throat:     Mouth: Mucous membranes are moist.     Pharynx: Pharyngeal swelling (cobblestoning) present.     Tonsils: No tonsillar exudate.  Eyes:     Extraocular Movements: Extraocular movements intact.     Conjunctiva/sclera: Conjunctivae normal.     Pupils: Pupils are equal, round, and reactive to light.  Neck:     Thyroid: No thyroid mass.  Cardiovascular:     Rate and Rhythm: Normal rate and regular rhythm.  Pulmonary:     Effort: Pulmonary effort is normal.     Breath sounds: Normal breath sounds.  Abdominal:     General: Abdomen is flat. Bowel sounds are normal.     Palpations: Abdomen is soft.  Musculoskeletal:        General: Normal range of motion.  Lymphadenopathy:     Cervical:     Right cervical: No  superficial cervical adenopathy.    Left cervical: No superficial cervical adenopathy.  Skin:    General: Skin is warm.     Capillary Refill: Capillary refill takes less than 2 seconds.  Neurological:     General: No focal deficit present.     Mental Status: She is alert and oriented to person, place, and time.  Psychiatric:        Mood and Affect: Mood normal.  Behavior: Behavior normal.        Thought Content: Thought content normal.        Judgment: Judgment normal.       BP 134/84   Pulse 92   Temp 98.4 F (36.9 C)   Resp 16   Ht 5' 3.75" (1.619 m)   Wt 172 lb 4 oz (78.1 kg)   LMP 05/24/2022 (Approximate)   SpO2 97%   BMI 29.80 kg/m  Wt Readings from Last 3 Encounters:  06/03/22 172 lb 4 oz (78.1 kg)  01/30/21 160 lb (72.6 kg)     Health Maintenance Due  Topic Date Due   COVID-19 Vaccine (1) Never done   HIV Screening  Never done   Hepatitis C Screening  Never done   PAP SMEAR-Modifier  Never done    There are no preventive care reminders to display for this patient.  No results found for: "TSH" Lab Results  Component Value Date   WBC 9.2 01/30/2021   HGB 13.6 01/30/2021   HCT 41.0 01/30/2021   MCV 85.8 01/30/2021   PLT 359 01/30/2021   Lab Results  Component Value Date   NA 137 01/30/2021   K 3.7 01/30/2021   CO2 25 01/30/2021   GLUCOSE 94 01/30/2021   BUN 10 01/30/2021   CREATININE 0.72 01/30/2021   BILITOT 0.8 01/30/2021   ALKPHOS 37 (L) 01/30/2021   AST 18 01/30/2021   ALT 9 01/30/2021   PROT 7.9 01/30/2021   ALBUMIN 4.5 01/30/2021   CALCIUM 9.1 01/30/2021   ANIONGAP 7 01/30/2021   No results found for: "CHOL" No results found for: "HDL" No results found for: "LDLCALC" No results found for: "TRIG" No results found for: "CHOLHDL" No results found for: "HGBA1C"    Assessment & Plan:   Problem List Items Addressed This Visit       Respiratory   Viral upper respiratory infection    Advised patient on supportive measures:   Be sure to rest, drink plenty of fluids, and use tylenol or ibuprofen as needed for pain. Follow up if fever >101, if symptoms worsen or if symptoms are not improved in 3 days. Patient verbalizes understanding.           Endocrine   Hypothyroidism    Continue levothyroxine synthroid 25 mcg brand name Ordering tsh today pending results.       Relevant Medications   SYNTHROID 125 MCG tablet   Other Relevant Orders   TSH   T4, free   Multinodular goiter    Will attempt to obtain h/o thyroid u/s  May need to continue annual u/s       Relevant Medications   SYNTHROID 125 MCG tablet     Other   Vitamin D deficiency    Ordered vitamin d pending results.        Relevant Orders   VITAMIN D 25 Hydroxy (Vit-D Deficiency, Fractures)   RESOLVED: Need for influenza vaccination - Primary   Relevant Orders   Flu Vaccine QUAD 74mo+IM (Fluarix, Fluzone & Alfiuria Quad PF) (Completed)   Other Visit Diagnoses     Other fatigue       Relevant Orders   CBC   Basic metabolic panel       Meds ordered this encounter  Medications   DISCONTD: SYNTHROID 125 MCG tablet    Sig: Take 1 tablet (125 mcg total) by mouth daily before breakfast.    Order Specific Question:   Supervising Provider  Answer:   BEDSOLE, AMY E [[id number]]   SYNTHROID 125 MCG tablet    Sig: Take 1 tablet (125 mcg total) by mouth daily before breakfast.    Order Specific Question:   Supervising Provider    Answer:   Diona Browner, AMY E [[id number]]    Follow-up: Return in about 6 months (around 12/02/2022) for f/u PAP.    Eugenia Pancoast, FNP

## 2022-06-03 NOTE — Patient Instructions (Signed)
  Welcome to our clinic, I am happy to have you as my new patient. I am excited to continue on this healthcare journey with you.  Stop by the lab prior to leaving today. I will notify you of your results once received.   Please keep in mind Any my chart messages you send have up to a three business day turnaround for a response.  Phone calls may take up to a one full business day turnaround for a  response.   If you need a medication refill I recommend you request it through the pharmacy as this is easiest for us rather than sending a message and or phone call.   Due to recent changes in healthcare laws, you may see results of your imaging and/or laboratory studies on MyChart before I have had a chance to review them.  I understand that in some cases there may be results that are confusing or concerning to you. Please understand that not all results are received at the same time and often I may need to interpret multiple results in order to provide you with the best plan of care or course of treatment. Therefore, I ask that you please give me 2 business days to thoroughly review all your results before contacting my office for clarification. Should we see a critical lab result, you will be contacted sooner.   It was a pleasure seeing you today! Please do not hesitate to reach out with any questions and or concerns.  Regards,   Chael Urenda FNP-C  

## 2022-06-03 NOTE — Assessment & Plan Note (Signed)
Ordered vitamin d pending results.   

## 2022-06-03 NOTE — Assessment & Plan Note (Signed)
Continue levothyroxine synthroid 25 mcg brand name Ordering tsh today pending results.

## 2022-06-04 LAB — CBC
HCT: 40 % (ref 36.0–46.0)
Hemoglobin: 12.9 g/dL (ref 12.0–15.0)
MCHC: 32.3 g/dL (ref 30.0–36.0)
MCV: 86 fl (ref 78.0–100.0)
Platelets: 443 10*3/uL — ABNORMAL HIGH (ref 150.0–400.0)
RBC: 4.65 Mil/uL (ref 3.87–5.11)
RDW: 14.4 % (ref 11.5–15.5)
WBC: 9.9 10*3/uL (ref 4.0–10.5)

## 2022-06-04 LAB — BASIC METABOLIC PANEL
BUN: 14 mg/dL (ref 6–23)
CO2: 28 mEq/L (ref 19–32)
Calcium: 9.4 mg/dL (ref 8.4–10.5)
Chloride: 102 mEq/L (ref 96–112)
Creatinine, Ser: 0.65 mg/dL (ref 0.40–1.20)
GFR: 113.15 mL/min (ref >60.00)
Glucose, Bld: 90 mg/dL (ref 70–99)
Potassium: 4.7 mEq/L (ref 3.5–5.1)
Sodium: 138 mEq/L (ref 135–145)

## 2022-06-04 LAB — TSH: TSH: 0.78 u[IU]/mL (ref 0.35–5.50)

## 2022-06-04 LAB — VITAMIN D 25 HYDROXY (VIT D DEFICIENCY, FRACTURES): VITD: 34.51 ng/mL (ref 30.00–100.00)

## 2022-06-04 LAB — T4, FREE: Free T4: 0.96 ng/dL (ref 0.60–1.60)

## 2022-06-08 ENCOUNTER — Other Ambulatory Visit: Payer: Self-pay | Admitting: Family

## 2022-06-08 DIAGNOSIS — R7989 Other specified abnormal findings of blood chemistry: Secondary | ICD-10-CM

## 2022-11-05 ENCOUNTER — Other Ambulatory Visit: Payer: Self-pay | Admitting: Family

## 2022-11-05 DIAGNOSIS — E039 Hypothyroidism, unspecified: Secondary | ICD-10-CM

## 2022-11-08 ENCOUNTER — Other Ambulatory Visit (HOSPITAL_COMMUNITY): Payer: Self-pay

## 2022-11-08 ENCOUNTER — Other Ambulatory Visit: Payer: Self-pay

## 2022-11-08 MED ORDER — SYNTHROID 125 MCG PO TABS
125.0000 ug | ORAL_TABLET | Freq: Every day | ORAL | 0 refills | Status: DC
  Filled 2022-11-08: qty 90, 90d supply, fill #0

## 2022-11-09 ENCOUNTER — Other Ambulatory Visit (HOSPITAL_COMMUNITY): Payer: Self-pay

## 2022-11-09 ENCOUNTER — Other Ambulatory Visit: Payer: Self-pay

## 2023-01-31 ENCOUNTER — Other Ambulatory Visit: Payer: Self-pay | Admitting: Family

## 2023-01-31 DIAGNOSIS — E039 Hypothyroidism, unspecified: Secondary | ICD-10-CM

## 2023-02-01 ENCOUNTER — Other Ambulatory Visit: Payer: Self-pay

## 2023-02-01 MED ORDER — SYNTHROID 125 MCG PO TABS
125.0000 ug | ORAL_TABLET | Freq: Every day | ORAL | 0 refills | Status: DC
  Filled 2023-02-01: qty 90, 90d supply, fill #0

## 2023-05-02 ENCOUNTER — Telehealth: Payer: Self-pay | Admitting: Family

## 2023-05-02 NOTE — Telephone Encounter (Signed)
Pt actually due for in office f/u will have to schedule. Labs can be drawn same day.

## 2023-05-02 NOTE — Telephone Encounter (Signed)
Scheduled f/u for 05/10/23

## 2023-05-02 NOTE — Telephone Encounter (Signed)
Pt called stating she had a prev lab order for CBC from last yr. Pt asked if a order for thyroid & a metabolic panel be submitted also? Pt states she believes she is due for thyroid recheck in order for med refill. Call back # [phone]

## 2023-05-02 NOTE — Telephone Encounter (Signed)
LM for pt to returncall

## 2023-05-10 ENCOUNTER — Encounter: Payer: Self-pay | Admitting: Family

## 2023-05-10 ENCOUNTER — Ambulatory Visit: Payer: BC Managed Care – PPO | Admitting: Family

## 2023-05-10 VITALS — BP 120/80 | HR 85 | Temp 97.5°F | Ht 63.75 in | Wt 180.6 lb

## 2023-05-10 DIAGNOSIS — M2550 Pain in unspecified joint: Secondary | ICD-10-CM | POA: Insufficient documentation

## 2023-05-10 DIAGNOSIS — I7300 Raynaud's syndrome without gangrene: Secondary | ICD-10-CM | POA: Diagnosis not present

## 2023-05-10 DIAGNOSIS — R7989 Other specified abnormal findings of blood chemistry: Secondary | ICD-10-CM | POA: Diagnosis not present

## 2023-05-10 DIAGNOSIS — G629 Polyneuropathy, unspecified: Secondary | ICD-10-CM | POA: Diagnosis not present

## 2023-05-10 DIAGNOSIS — M25672 Stiffness of left ankle, not elsewhere classified: Secondary | ICD-10-CM

## 2023-05-10 DIAGNOSIS — E063 Autoimmune thyroiditis: Secondary | ICD-10-CM

## 2023-05-10 DIAGNOSIS — M25671 Stiffness of right ankle, not elsewhere classified: Secondary | ICD-10-CM | POA: Insufficient documentation

## 2023-05-10 LAB — CBC WITH DIFFERENTIAL/PLATELET
Basophils Absolute: 0.1 10*3/uL (ref 0.0–0.1)
Basophils Relative: 0.9 % (ref 0.0–3.0)
Eosinophils Absolute: 0.2 10*3/uL (ref 0.0–0.7)
Eosinophils Relative: 2.3 % (ref 0.0–5.0)
HCT: 38.6 % (ref 36.0–46.0)
Hemoglobin: 12.6 g/dL (ref 12.0–15.0)
Lymphocytes Relative: 31.8 % (ref 12.0–46.0)
Lymphs Abs: 2.2 10*3/uL (ref 0.7–4.0)
MCHC: 32.6 g/dL (ref 30.0–36.0)
MCV: 84.6 fL (ref 78.0–100.0)
Monocytes Absolute: 0.5 10*3/uL (ref 0.1–1.0)
Monocytes Relative: 6.6 % (ref 3.0–12.0)
Neutro Abs: 4.1 10*3/uL (ref 1.4–7.7)
Neutrophils Relative %: 58.4 % (ref 43.0–77.0)
Platelets: 379 10*3/uL (ref 150.0–400.0)
RBC: 4.57 Mil/uL (ref 3.87–5.11)
RDW: 15.1 % (ref 11.5–15.5)
WBC: 7 10*3/uL (ref 4.0–10.5)

## 2023-05-10 LAB — SEDIMENTATION RATE: Sed Rate: 7 mm/h (ref 0–20)

## 2023-05-10 LAB — T4, FREE: Free T4: 1.02 ng/dL (ref 0.60–1.60)

## 2023-05-10 LAB — TSH: TSH: 0.6 u[IU]/mL (ref 0.35–5.50)

## 2023-05-10 LAB — VITAMIN B12: Vitamin B-12: 191 pg/mL — ABNORMAL LOW (ref 211–911)

## 2023-05-10 NOTE — Assessment & Plan Note (Signed)
B12 ordered pending results °

## 2023-05-10 NOTE — Assessment & Plan Note (Signed)
Tsh and free t4 ordered Reviewed u/s thyroid 2024 in [facility]. Stable.

## 2023-05-10 NOTE — Assessment & Plan Note (Signed)
With polyarthralgia Workup for autoimmune arthritis pending results.

## 2023-05-10 NOTE — Progress Notes (Signed)
Established Patient Office Visit  Subjective:      CC:  Chief Complaint  Patient presents with   Medical Management of Chronic Issues    HPI: Angela Guerrero is a 37 y.o. female presenting on 05/10/2023 for Medical Management of Chronic Issues . Hypothyroid: taking levothyroxine 125 mcg once daily dose.  Lab Results  Component Value Date   TSH 0.78 06/03/2022     New complaints: Within the last few months bil lower legs, started with the right ankle. Stiffness in the am that she describes as 'really bad' sitting and or rest makes it worse and at times will feel a burning pain right around the lateral aspect of the ankle. She does note some blil hand involvement with stiffness as well especially in the am.   First cousin with lupus.  Sister with hashimotos  Pgm, hashimotos  Thyroid u/s last 11/10/21 heterogenous thyroid most consistent with thyroiditis.         Social history:  Relevant past medical, surgical, family and social history reviewed and updated as indicated. Interim medical history since our last visit reviewed.  Allergies and medications reviewed and updated.  DATA REVIEWED: CHART IN EPIC     ROS: Negative unless specifically indicated above in HPI.    Current Outpatient Medications:    SYNTHROID 125 MCG tablet, Take 1 tablet (125 mcg total) by mouth daily before breakfast. Need appt, Disp: 90 tablet, Rfl: 0      Objective:    BP 120/80 (BP Location: Right Arm, Patient Position: Sitting, Cuff Size: Normal)   Pulse 85   Temp (!) 97.5 F (36.4 C) (Temporal)   Ht 5' 3.75" (1.619 m)   Wt 180 lb 9.6 oz (81.9 kg)   SpO2 97%   BMI 31.24 kg/m   Wt Readings from Last 3 Encounters:  05/10/23 180 lb 9.6 oz (81.9 kg)  06/03/22 172 lb 4 oz (78.1 kg)  01/30/21 160 lb (72.6 kg)    Physical Exam Constitutional:      General: She is not in acute distress.    Appearance: Normal appearance. She is normal weight. She is not ill-appearing,  toxic-appearing or diaphoretic.  HENT:     Head: Normocephalic.  Neck:     Thyroid: No thyroid mass, thyromegaly or thyroid tenderness.  Cardiovascular:     Rate and Rhythm: Normal rate and regular rhythm.  Pulmonary:     Effort: Pulmonary effort is normal.  Musculoskeletal:        General: Normal range of motion.     Right hand: No bony tenderness. Normal range of motion.     Left hand: No bony tenderness. Normal range of motion.     Right ankle: No tenderness. Normal range of motion.     Left ankle: No tenderness. Normal range of motion.  Neurological:     General: No focal deficit present.     Mental Status: She is alert and oriented to person, place, and time. Mental status is at baseline.  Psychiatric:        Mood and Affect: Mood normal.        Behavior: Behavior normal.        Thought Content: Thought content normal.        Judgment: Judgment normal.           Assessment & Plan:  Raynaud's phenomenon without gangrene  Ankle joint stiffness, bilateral Assessment & Plan: With polyarthralgia Workup for autoimmune arthritis pending results.   Polyarthralgia -  Sedimentation rate -     Rheumatoid factor -     CBC with Differential/Platelet -     ANA Screen,IFA, with Reflex to Titer and Pattern (REFL)  Neuropathy Assessment & Plan: B12 ordered pending results.   Orders: -     Vitamin B12 -     CBC with Differential/Platelet  Hashimoto's thyroiditis Assessment & Plan: Tsh and free t4 ordered Reviewed u/s thyroid 2024 in [facility]. Stable.   Orders: -     TSH -     T4, free -     CBC with Differential/Platelet  Elevated platelet count -     CBC with Differential/Platelet     Return in about 1 year (around 05/09/2024) for f/u CPE.  Mort Sawyers, MSN, APRN, FNP-C [facility]

## 2023-05-12 LAB — ANA TITER AND PATTERN REFLEX: ANA Titer 1: 1:40 {titer} — ABNORMAL HIGH

## 2023-05-12 LAB — RHEUMATOID FACTOR: Rheumatoid fact SerPl-aCnc: <10 [IU]/mL (ref <14)

## 2023-05-12 LAB — ANA SCREEN,IFA, WITH REFLEX TO TITER AND PATTERN (REFL): ANA SCREEN, IFA: POSITIVE — AB

## 2023-05-13 ENCOUNTER — Other Ambulatory Visit: Payer: Self-pay | Admitting: Family

## 2023-05-13 DIAGNOSIS — R768 Other specified abnormal immunological findings in serum: Secondary | ICD-10-CM

## 2023-05-13 DIAGNOSIS — M25671 Stiffness of right ankle, not elsewhere classified: Secondary | ICD-10-CM

## 2023-05-13 DIAGNOSIS — E063 Autoimmune thyroiditis: Secondary | ICD-10-CM

## 2023-05-13 DIAGNOSIS — I7300 Raynaud's syndrome without gangrene: Secondary | ICD-10-CM

## 2023-05-16 ENCOUNTER — Telehealth: Payer: Self-pay | Admitting: Family

## 2023-05-16 ENCOUNTER — Other Ambulatory Visit (HOSPITAL_COMMUNITY): Payer: Self-pay

## 2023-05-16 DIAGNOSIS — E538 Deficiency of other specified B group vitamins: Secondary | ICD-10-CM

## 2023-05-16 MED ORDER — CYANOCOBALAMIN 1000 MCG/ML IJ SOLN
INTRAMUSCULAR | 2 refills | Status: DC
Start: 2023-05-16 — End: 2023-05-16

## 2023-05-16 MED ORDER — CYANOCOBALAMIN 1000 MCG/ML IJ SOLN
1000.0000 ug | INTRAMUSCULAR | 2 refills | Status: DC
Start: 2023-05-16 — End: 2023-09-26
  Filled 2023-05-16 – 2023-08-15 (×5): qty 1, 30d supply, fill #0

## 2023-05-16 NOTE — Telephone Encounter (Signed)
Patient was wondering if Angela Guerrero could send an rx for her B12 injections to [facility]? Pt has an appointment for a B12 on Friday, and has a work conflict. 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

## 2023-05-17 ENCOUNTER — Other Ambulatory Visit (HOSPITAL_COMMUNITY): Payer: Self-pay

## 2023-05-17 NOTE — Addendum Note (Signed)
Addended by: Mort Sawyers on: 05/17/2023 12:58 PM   Modules accepted: Orders

## 2023-05-20 ENCOUNTER — Ambulatory Visit: Payer: BC Managed Care – PPO

## 2023-05-24 ENCOUNTER — Other Ambulatory Visit: Payer: Self-pay | Admitting: Family

## 2023-05-24 ENCOUNTER — Other Ambulatory Visit: Payer: Self-pay

## 2023-05-24 ENCOUNTER — Other Ambulatory Visit (HOSPITAL_COMMUNITY): Payer: Self-pay

## 2023-05-24 DIAGNOSIS — E039 Hypothyroidism, unspecified: Secondary | ICD-10-CM

## 2023-05-24 MED ORDER — SYNTHROID 125 MCG PO TABS
125.0000 ug | ORAL_TABLET | Freq: Every day | ORAL | 1 refills | Status: DC
  Filled 2023-05-24: qty 90, 90d supply, fill #0

## 2023-05-25 ENCOUNTER — Other Ambulatory Visit (HOSPITAL_COMMUNITY): Payer: Self-pay

## 2023-05-25 ENCOUNTER — Other Ambulatory Visit: Payer: Self-pay

## 2023-06-07 ENCOUNTER — Other Ambulatory Visit: Payer: Self-pay

## 2023-06-07 ENCOUNTER — Other Ambulatory Visit (HOSPITAL_COMMUNITY): Payer: Self-pay

## 2023-06-08 ENCOUNTER — Other Ambulatory Visit (HOSPITAL_COMMUNITY): Payer: Self-pay

## 2023-06-09 ENCOUNTER — Other Ambulatory Visit: Payer: Self-pay

## 2023-06-22 ENCOUNTER — Ambulatory Visit: Payer: BC Managed Care – PPO | Admitting: Family

## 2023-06-22 ENCOUNTER — Encounter: Payer: Self-pay | Admitting: Family

## 2023-06-22 VITALS — BP 124/88 | HR 84 | Temp 98.0°F | Ht 62.0 in | Wt 182.2 lb

## 2023-06-22 DIAGNOSIS — E063 Autoimmune thyroiditis: Secondary | ICD-10-CM | POA: Diagnosis not present

## 2023-06-22 DIAGNOSIS — N920 Excessive and frequent menstruation with regular cycle: Secondary | ICD-10-CM | POA: Diagnosis not present

## 2023-06-22 DIAGNOSIS — M130 Polyarthritis, unspecified: Secondary | ICD-10-CM | POA: Diagnosis not present

## 2023-06-22 DIAGNOSIS — E538 Deficiency of other specified B group vitamins: Secondary | ICD-10-CM | POA: Diagnosis not present

## 2023-06-22 DIAGNOSIS — M25671 Stiffness of right ankle, not elsewhere classified: Secondary | ICD-10-CM | POA: Diagnosis not present

## 2023-06-22 DIAGNOSIS — M25672 Stiffness of left ankle, not elsewhere classified: Secondary | ICD-10-CM

## 2023-06-22 LAB — FOLLICLE STIMULATING HORMONE: FSH: 3.6 m[IU]/mL

## 2023-06-22 LAB — B12 AND FOLATE PANEL
Folate: >24.2 ng/mL (ref >5.9)
Vitamin B-12: 452 pg/mL (ref 211–911)

## 2023-06-22 LAB — TSH: TSH: 0.23 u[IU]/mL — ABNORMAL LOW (ref 0.35–5.50)

## 2023-06-22 MED ORDER — PREDNISONE 5 MG PO TABS
ORAL_TABLET | ORAL | 0 refills | Status: DC
Start: 2023-06-22 — End: 2023-09-26

## 2023-06-22 NOTE — Assessment & Plan Note (Addendum)
Take levothyroxine 125 mcg x 6 days, don't take the 7th Repeat TSH in six weeks Trial to see if periods joint pains and heat sensitivity improve

## 2023-06-22 NOTE — Assessment & Plan Note (Signed)
Trial prednisone taper  Ultimately pt pending consult with rheumatology Until then supportive conservative measures.

## 2023-06-22 NOTE — Patient Instructions (Signed)
  I have sent in your order electronically for the following: u/s transvaginal  at this location below. Please call to schedule the appointment at your convenience  [facility] outpatient imaging center off [address], [place] [address] Phone [phone]-  8-5 pm   ------------------------------------

## 2023-06-22 NOTE — Assessment & Plan Note (Signed)
Transvaginal u/s ordered pending results.  Workup for pcos lab wise also in effect

## 2023-06-22 NOTE — Progress Notes (Unsigned)
Established Patient Office Visit  Subjective:   Patient ID: Angela Guerrero, female    DOB: 1986-01-12  Age: 37 y.o. MRN: [id number]  CC:  Chief Complaint  Patient presents with  . Joint Pain    Joint pain in bilateral ankles. Has been doing injections and oral medication and feels like they aren't helping.    HPI: Angela Guerrero is a 37 y.o. female presenting on 06/22/2023 for Joint Pain (Joint pain in bilateral ankles. Has been doing injections and oral medication and feels like they aren't helping.)  Bil ankle and hand stiffness, worse with sitting and or resting. Hard to work out especially in the am. Upper ext worse than lower ext however especially the right ankle over the left, makes it hard for her to work. Has had a low positive ANA. Does report in the right ankle she has the stiffness, pain, and at the end of the day her heel is very sore as well as lateral ankle malleolus. Will cause her to limp halfway through the day as well. She wears crocs mainly because she has a bunion that hurts in normal shoes. She wears crocs daily but does have insoles.  She stands for 10 + hours at work that doesn't help.   Menses: progressively over the last six months periods have started to get heavier. Has tried OCP in the past but didn't like the side effects. Doesn't seem to be improving.   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      ROS: Negative unless specifically indicated above in HPI.   Relevant past medical history reviewed and updated as indicated.   Allergies and medications reviewed and updated.   Current Outpatient Medications:  .  cyanocobalamin (VITAMIN B12) 1000 MCG/ML injection, Inject 1 mL (1,000 mcg total) into the muscle every 30 (thirty) days for 3 months., Disp: 1 mL, Rfl: 2 .  predniSONE (DELTASONE) 5 MG tablet, Take 4 tabs po x 4 days, 3  tabs po x 4 days, 2  tabs po x 4 days, 1  tab po x 4 days, Disp: 40 tablet, Rfl: 0 .  SYNTHROID  125 MCG tablet, Take 1 tablet (125 mcg total) by mouth daily before breakfast., Disp: 90 tablet, Rfl: 1  Allergies  Allergen Reactions  . Percocet [Oxycodone-Acetaminophen] Diarrhea    Objective:   BP 124/88 (BP Location: Right Arm, Patient Position: Sitting, Cuff Size: Normal)   Pulse 84   Temp 98 F (36.7 C) (Temporal)   Ht 5\' 2"  (1.575 m)   Wt 182 lb 3.2 oz (82.6 kg)   LMP 06/13/2023   SpO2 98%   BMI 33.32 kg/m    Physical Exam Constitutional:      General: She is not in acute distress.    Appearance: Normal appearance. She is normal weight. She is not ill-appearing, toxic-appearing or diaphoretic.  HENT:     Head: Normocephalic.  Cardiovascular:     Rate and Rhythm: Normal rate.  Pulmonary:     Effort: Pulmonary effort is normal.  Abdominal:     General: Bowel sounds are normal.     Palpations: Abdomen is soft.     Tenderness: There is no abdominal tenderness.     Hernia: No hernia is present.  Musculoskeletal:        General: Normal range of motion.     Right lower leg: No edema.     Left lower leg: No edema.     Right ankle: No swelling  or deformity. Tenderness present over the lateral malleolus. Decreased range of motion: some pain with flexion extension in heel.  Neurological:     General: No focal deficit present.     Mental Status: She is alert and oriented to person, place, and time. Mental status is at baseline.  Psychiatric:        Mood and Affect: Mood normal.        Behavior: Behavior normal.        Thought Content: Thought content normal.        Judgment: Judgment normal.    Assessment & Plan:  Polyarthritis Assessment & Plan: Trial prednisone taper  Ultimately pt pending consult with rheumatology Until then supportive conservative measures.   Orders: -     predniSONE; Take 4 tabs po x 4 days, 3  tabs po x 4 days, 2  tabs po x 4 days, 1  tab po x 4 days  Dispense: 40 tablet; Refill: 0  Menorrhagia with regular cycle Assessment &  Plan: Transvaginal u/s ordered pending results.  Workup for pcos lab wise also in effect  Orders: -     US PELVIC COMPLETE WITH TRANSVAGINAL; Future -     Follicle stimulating hormone -     Prolactin -     Testos,Total,Free and SHBG (Female)  Ankle joint stiffness, bilateral Assessment & Plan: + ANA no improvement with symptoms Pending consult with rheumatology in March 2024    Hypothyroidism due to Hashimoto thyroiditis Assessment & Plan: Take levothyroxine 125 mcg x 6 days, don't take the 7th Repeat TSH in six weeks Trial to see if periods joint pains and heat sensitivity improve   Orders: -     TSH; Future  Vitamin B12 deficiency -     B12 and Folate Panel; Future     Follow up plan: Return in about 3 months (around 09/22/2023).  Mort Sawyers, FNP

## 2023-06-22 NOTE — Progress Notes (Signed)
TSH and B12 were in future orders, the [facility] prolactin and testosterone were ordered for today upon sending her over to the lab, however looks like TSH and B12 were also drawn today.   Tsh and B12 are drawn too early and likely insurance will not cover. How can we rectify this so she is not charged for these two labs? I did a dose change today that prompted me to place these as future orders so they could be repeated in six weeks from now.

## 2023-06-22 NOTE — Assessment & Plan Note (Signed)
+   ANA no improvement with symptoms Pending consult with rheumatology in March 2024

## 2023-06-28 LAB — TESTOS,TOTAL,FREE AND SHBG (FEMALE)
Free Testosterone: 3.8 pg/mL (ref 0.1–6.4)
Sex Hormone Binding: 70.9 nmol/L (ref 17–124)
Testosterone, Total, LC-MS-MS: 45 ng/dL (ref 2–45)

## 2023-06-28 LAB — PROLACTIN: Prolactin: 5.9 ng/mL

## 2023-07-08 ENCOUNTER — Ambulatory Visit
Admission: RE | Admit: 2023-07-08 | Discharge: 2023-07-08 | Disposition: A | Discharge status: Home / Self Care | Payer: BC Managed Care – PPO | Source: Ambulatory Visit | Attending: Family

## 2023-07-08 DIAGNOSIS — N920 Excessive and frequent menstruation with regular cycle: Secondary | ICD-10-CM | POA: Diagnosis present

## 2023-07-10 ENCOUNTER — Other Ambulatory Visit: Payer: Self-pay | Admitting: Family

## 2023-07-10 DIAGNOSIS — N920 Excessive and frequent menstruation with regular cycle: Secondary | ICD-10-CM

## 2023-07-10 DIAGNOSIS — R935 Abnormal findings on diagnostic imaging of other abdominal regions, including retroperitoneum: Secondary | ICD-10-CM | POA: Insufficient documentation

## 2023-07-10 NOTE — Progress Notes (Unsigned)
    Jonell Krontz T. Daly Whipkey, MD, CAQ Sports Medicine [facility] [address] [place], [address]  Phone: [phone]  FAX: [phone]  Angela Guerrero - 37 y.o. female  MRN [id number]  Date of Birth: 22-Aug-1985  Date: 07/11/2023  PCP: Mort Sawyers, FNP  Referral: Mort Sawyers, FNP  No chief complaint on file.  Subjective:   Vinnie Arnoux is a 37 y.o. very pleasant female patient with There is no height or weight on file to calculate BMI. who presents with the following:  She is a very pleasant lady and she presents with some ongoing right-sided ankle pain.  She is referred courtesy of Ms. Dugal.    Review of Systems is noted in the HPI, as appropriate  Objective:   LMP 06/13/2023   GEN: No acute distress; alert,appropriate. PULM: Breathing comfortably in no respiratory distress PSYCH: Normally interactive.   Laboratory and Imaging Data:  Assessment and Plan:   ***

## 2023-07-11 ENCOUNTER — Encounter: Payer: Self-pay | Admitting: Family Medicine

## 2023-07-11 ENCOUNTER — Ambulatory Visit: Payer: BC Managed Care – PPO | Admitting: Family Medicine

## 2023-07-11 VITALS — BP 120/72 | HR 95 | Temp 97.8°F | Ht 62.0 in | Wt 181.4 lb

## 2023-07-11 DIAGNOSIS — M25571 Pain in right ankle and joints of right foot: Secondary | ICD-10-CM

## 2023-07-11 DIAGNOSIS — M7671 Peroneal tendinitis, right leg: Secondary | ICD-10-CM | POA: Diagnosis not present

## 2023-07-11 MED ORDER — NORTRIPTYLINE HCL 25 MG PO CAPS: 25.0000 mg | ORAL_CAPSULE | Freq: Every day | ORAL | 2 refills | Status: DC

## 2023-07-11 NOTE — Patient Instructions (Addendum)
[place] - wide toebox shoes  Birkenstock shoes - birkenstock shoe at [employer] [facility] [employer] on [address]. In [place]    Posterior Tib and arch rehab Begin with easy walking, heel, toe and backwards * Try to pick an easy location like a hallway or a room in your house and do one of these each time that you go through this area.  Calf raises - ok to start seated calf raises Try to do most days of the week If pain persists at 3 sets of 30 - add backpack with 5 lbs Increase by 5 lbs per week to max of 30 lbs  Towel "Scrunch Ups" Use a hand towel or a moderate size towel Foot flat down on the towel Use toes to "scrunch up the towel" straight up and down, and going to the right and left.  3 sets of 20 * Can be done watching TV, reading, or sitting and relaxing.

## 2023-07-12 ENCOUNTER — Encounter: Payer: Self-pay | Admitting: Family Medicine

## 2023-07-19 ENCOUNTER — Other Ambulatory Visit (INDEPENDENT_AMBULATORY_CARE_PROVIDER_SITE_OTHER): Payer: BC Managed Care – PPO

## 2023-07-19 DIAGNOSIS — E063 Autoimmune thyroiditis: Secondary | ICD-10-CM | POA: Diagnosis not present

## 2023-07-19 DIAGNOSIS — E538 Deficiency of other specified B group vitamins: Secondary | ICD-10-CM | POA: Diagnosis not present

## 2023-07-19 LAB — TSH: TSH: 0.5 u[IU]/mL (ref 0.35–5.50)

## 2023-07-19 LAB — VITAMIN B12: Vitamin B-12: 417 pg/mL (ref 211–911)

## 2023-07-20 ENCOUNTER — Other Ambulatory Visit: Payer: Self-pay | Admitting: Family

## 2023-07-20 DIAGNOSIS — E039 Hypothyroidism, unspecified: Secondary | ICD-10-CM

## 2023-07-20 MED ORDER — SYNTHROID 125 MCG PO TABS
ORAL_TABLET | ORAL | Status: DC
Start: 2023-07-20 — End: 2023-09-27

## 2023-07-20 NOTE — Progress Notes (Signed)
noted 

## 2023-08-02 ENCOUNTER — Encounter: Payer: BC Managed Care – PPO | Admitting: Obstetrics and Gynecology

## 2023-08-08 ENCOUNTER — Other Ambulatory Visit: Payer: Self-pay | Admitting: Family

## 2023-08-08 DIAGNOSIS — E538 Deficiency of other specified B group vitamins: Secondary | ICD-10-CM

## 2023-08-09 ENCOUNTER — Encounter: Payer: BC Managed Care – PPO | Admitting: Obstetrics

## 2023-08-12 NOTE — Patient Instructions (Signed)
Menorrhagia Menorrhagia is when your monthly periods are heavy or last longer than normal. If you have this condition, bleeding and cramping may make it hard for you to do your daily activities. What are the causes? Common causes of this condition include: Growths in the womb (uterus). These are polyps or fibroids. These growths are not cancer. Problems with two hormones called estrogen and progesterone. One of the ovaries not releasing an egg during one or more months. A problem with the thyroid gland. Having a device for birth control (IUD). Side effects of some medicines, such as NSAIDs or blood thinners. A disorder that stops the blood from clotting normally. What increases the risk? You are more likely to have this condition if you have cancer of the womb. What are the signs or symptoms? Having to change your pad or tampon every 1-2 hours because it is soaked. Needing to use pads and tampons at the same time because of heavy bleeding. Needing to wake up to change your pads or tampons during the night. Passing blood clots larger than 1 inch (2.5 cm) in size. Having bleeding that lasts for more than 7 days. Having symptoms of low iron levels (anemia), such as feeling tired or having shortness of breath. How is this treated? You may not need to be treated for this condition. But if you need treatment, you may be given medicines: To reduce bleeding during your period. These include birth control medicines. To make your blood thick. This slows bleeding. To reduce swelling. Medicines that do this include ibuprofen. That have a hormone called progestin. That make the ovaries stop working for a short time. To treat low iron levels. You will be given iron pills if you have this condition. If medicines do not work, surgery may be done. Surgery may be done to: Remove a part of the lining of the womb. This lining is called the endometrium. This reduces bleeding during a period. Remove growths  in the womb. These may be polyps or fibroids. Remove the entire lining of the womb. Remove the womb entirely. This procedure is called a hysterectomy. Follow these instructions at home: Medicines Take over-the-counter and prescription medicines only as told by your doctor. This includes iron pills. Do not change or switch medicines without asking your doctor. Do not take aspirin or medicines that contain aspirin 1 week before or during your period. Aspirin may make bleeding worse. Managing constipation Iron pills may cause trouble pooping (constipation). To prevent or treat problems when pooping, you may need to: Drink enough fluid to keep your pee (urine) pale yellow. Take over-the-counter or prescription medicines. Eat foods that are high in fiber. These include beans, whole grains, and fresh fruits and vegetables. Limit foods that are high in fat and sugar. These include fried or sweet foods. General instructions If you need to change your pad or tampon more than once every 2 hours, limit your activity until the bleeding stops. Eat healthy meals and foods that are high in iron. Foods that have a lot of iron include: Leafy green vegetables. Meat. Liver. Eggs. Whole-grain breads and cereals. Do not try to lose weight until your heavy bleeding has stopped and you have normal amounts of iron in your blood. If you need to lose weight, work with your doctor. Keep all follow-up visits. Contact a doctor if: You soak through a pad or tampon every 1 or 2 hours, and this happens every time you have a period. You need to use pads and   tampons at the same time because you are bleeding so much. You are taking medicine, and: You feel like you may vomit. You vomit. You have watery poop (diarrhea). You have other problems that may be related to the medicine you are taking. Get help right away if: You soak through more than a pad or tampon in 1 hour. You pass clots bigger than 1 inch (2.5 cm)  wide. You feel short of breath. You feel like your heart is beating too fast. You feel dizzy or you faint. You feel very weak or tired. Summary Menorrhagia is when your menstrual periods are heavy or last longer than normal. You may not need to be treated for this condition. If you need treatment, you may be given medicines or have surgery. Take over-the-counter and prescription medicines only as told by your doctor. This includes iron pills. Get help right away if you soak through more than a pad or tampon in 1 hour or you pass large clots. Also, get help right away if you feel dizzy, short of breath, or very weak or tired. This information is not intended to replace advice given to you by your health care provider. Make sure you discuss any questions you have with your health care provider. Document Revised: 04/01/2020 Document Reviewed: 04/01/2020 Elsevier Patient Education  2024 [employer].

## 2023-08-12 NOTE — Progress Notes (Signed)
 GYNECOLOGY: NEW PATIENT  Subjective:    PCP: Corwin Antu, FNP Angela Guerrero is a 38 y.o. female [id number] who was referred by her PCP for heavy periods for the past 6 months. Periods are monthly, every 28-30 days, last 6-7 days, flow starts out moderate and becomes heavy for a few days where she needs to change her pad every hour, sometimes more frequently, and then flow lightens toward the end. She denies clots or painful cramping and is not currently on any treatment for this. PCP obtained a pelvic US  on 07/08/23 that showed EMT of 2.1cm and a 1.9cm hypoechoic lesion in the fundus favoring endometrial polyp vs fibroid.   GYN HISTORY:  Patient's last menstrual period was 08/06/2023.     Menstrual History: OB History     Gravida  3   Para  2   Term  2   Preterm      AB  1   Living  2      SAB      IAB      Ectopic      Multiple      Live Births  2           Menarche age: 17 Patient's last menstrual period was 08/06/2023.   Cyclic symptoms include: bloating, breast tenderness, headache, moodiness, and pelvic pain.  Intermenstrual bleeding, spotting, or discharge? no Urinary incontinence? no  Sexually active: yes  Number of sexual partners: 1 Gender of sexual Partners: male  Social History   Substance and Sexual Activity  Sexual Activity Yes   Partners: Male   Birth control/protection: Condom   Contraceptive methods: condoms Dyspareunia? no STI history: no STI/HIV testing or immunizations needed? No.   Social History   Tobacco Use   Smoking status: Former    Current packs/day: 0.00    Types: Cigarettes    Start date: 12/31/2005    Quit date: 12/31/2020    Years since quitting: 2.6   Smokeless tobacco: Never  Substance Use Topics   Alcohol use: Yes    Comment: 3/wk   Occupation: [employer]     Lives with: husband    _________________________________________________________  Current Outpatient Medications  Medication Sig Dispense Refill    naproxen  (NAPROSYN ) 500 MG tablet Take 1 tablet (500 mg total) by mouth 2 (two) times daily with a meal. Start 48-72hrs prior to onset of menses and take regularly throughout menses. 30 tablet 2   SYNTHROID  125 MCG tablet Take one po every day for six days, don't take on day 7     cyanocobalamin  (VITAMIN B12) 1000 MCG/ML injection Inject 1 mL (1,000 mcg total) into the muscle every 30 (thirty) days for 3 months. (Patient not taking: Reported on 08/15/2023) 1 mL 2   nortriptyline  (PAMELOR ) 25 MG capsule Take 1 capsule (25 mg total) by mouth at bedtime. (Patient not taking: Reported on 08/15/2023) 30 capsule 2   predniSONE  (DELTASONE ) 5 MG tablet Take 4 tabs po x 4 days, 3  tabs po x 4 days, 2  tabs po x 4 days, 1  tab po x 4 days (Patient not taking: Reported on 08/15/2023) 40 tablet 0   No current facility-administered medications for this visit.   Allergies  Allergen Reactions   Percocet [Oxycodone-Acetaminophen] Diarrhea    Past Medical History:  Diagnosis Date   Hashimoto's thyroiditis    Past Surgical History:  Procedure Laterality Date   WISDOM TOOTH EXTRACTION      Review Of Systems  Constitutional: Denied constitutional symptoms, night sweats, recent illness, fatigue, fever, insomnia and weight loss.  Eyes: Denied eye symptoms, eye pain, photophobia, vision change and visual disturbance.  Ears/Nose/Throat/Neck: Denied ear, nose, throat or neck symptoms, hearing loss, nasal discharge, sinus congestion and sore throat.  Cardiovascular: Denied cardiovascular symptoms, arrhythmia, chest pain/pressure, edema, exercise intolerance, orthopnea and palpitations.  Respiratory: Denied pulmonary symptoms, asthma, pleuritic pain, productive sputum, cough, dyspnea and wheezing.  Gastrointestinal: Denied, gastro-esophageal reflux, melena, nausea and vomiting.  Genitourinary: Heavy menstrual bleeding   Musculoskeletal: Denied musculoskeletal symptoms, stiffness, swelling, muscle weakness and  myalgia.  Dermatologic: Denied dermatology symptoms, rash and scar.  Neurologic: Denied neurology symptoms, dizziness, headache, neck pain and syncope.  Psychiatric: Denied psychiatric symptoms, anxiety and depression.  Endocrine: Denied endocrine symptoms including hot flashes and night sweats.      Objective:    BP 125/87   Ht 5' 2 (1.575 m)   Wt 184 lb (83.5 kg)   LMP 08/06/2023   BMI 33.65 kg/m   Physical Exam Vitals and nursing note reviewed. Exam conducted with a chaperone present.  Constitutional:      Appearance: Normal appearance.  HENT:     Head: Normocephalic and atraumatic.  Eyes:     Extraocular Movements: Extraocular movements intact.  Cardiovascular:     Rate and Rhythm: Normal rate and regular rhythm.  Pulmonary:     Effort: Pulmonary effort is normal.  Abdominal:     General: Abdomen is flat.     Palpations: Abdomen is soft.  Genitourinary:    General: Normal vulva.     Vagina: Normal.     Cervix: Normal.     Uterus: Normal.   Musculoskeletal:        General: Normal range of motion.  Neurological:     General: No focal deficit present.     Mental Status: She is alert.  Psychiatric:        Mood and Affect: Mood normal.        Behavior: Behavior normal.       Assessment/Plan:    Angela Guerrero is a 38 y.o. female [phone] with HMB with regular cycles.  Discussed management options for her heavy bleeding including expectant, NSAIDs, tranexamic acid (Lysteda), oral progesterone (Provera, norethindrone, megace), Depo Provera, Levonorgestrel containing IUD, endometrial ablation (Novasure) or hysterectomy as definitive surgical management.  Discussed risks and benefits of each method.  Pt desires expectant management at this time, as her most recent cycle seemed to be more at baseline. If anything, she would like to try NSAIDs if the heaviness returns. I have prescribed Naproxen  that she can use in the future if HMB recurs. Side effects and dosing were  reviewed. Pap was obtained today as she was due.   Return in about 6 months (around 02/12/2024), or if symptoms worsen or fail to improve.    Estil Mangle, DO [facility] at [facility]

## 2023-08-15 ENCOUNTER — Encounter: Payer: Self-pay | Admitting: Obstetrics

## 2023-08-15 ENCOUNTER — Ambulatory Visit: Payer: BC Managed Care – PPO | Admitting: Obstetrics

## 2023-08-15 ENCOUNTER — Other Ambulatory Visit (HOSPITAL_COMMUNITY)
Admission: RE | Admit: 2023-08-15 | Discharge: 2023-08-15 | Disposition: A | Discharge status: Home / Self Care | Payer: BC Managed Care – PPO | Source: Ambulatory Visit | Attending: Obstetrics | Admitting: Obstetrics

## 2023-08-15 VITALS — BP 125/87 | Ht 62.0 in | Wt 184.0 lb

## 2023-08-15 DIAGNOSIS — Z124 Encounter for screening for malignant neoplasm of cervix: Secondary | ICD-10-CM

## 2023-08-15 DIAGNOSIS — N920 Excessive and frequent menstruation with regular cycle: Secondary | ICD-10-CM

## 2023-08-15 MED ORDER — NAPROXEN 500 MG PO TABS: 500.0000 mg | ORAL_TABLET | Freq: Two times a day (BID) | ORAL | 2 refills | Status: AC

## 2023-08-16 ENCOUNTER — Other Ambulatory Visit: Payer: Self-pay

## 2023-08-19 LAB — CYTOLOGY - PAP
Comment: NEGATIVE
Diagnosis: UNDETERMINED — AB
High risk HPV: NEGATIVE

## 2023-08-23 ENCOUNTER — Encounter: Payer: Self-pay | Admitting: Obstetrics

## 2023-09-22 ENCOUNTER — Ambulatory Visit: Payer: BC Managed Care – PPO | Admitting: Family

## 2023-09-26 ENCOUNTER — Ambulatory Visit: Payer: BC Managed Care – PPO | Admitting: Family

## 2023-09-26 ENCOUNTER — Encounter: Payer: Self-pay | Admitting: Family

## 2023-09-26 VITALS — BP 110/74 | HR 76 | Temp 98.4°F | Ht 62.0 in | Wt 182.0 lb

## 2023-09-26 DIAGNOSIS — M21611 Bunion of right foot: Secondary | ICD-10-CM | POA: Insufficient documentation

## 2023-09-26 DIAGNOSIS — M130 Polyarthritis, unspecified: Secondary | ICD-10-CM

## 2023-09-26 DIAGNOSIS — E7800 Pure hypercholesterolemia, unspecified: Secondary | ICD-10-CM | POA: Diagnosis not present

## 2023-09-26 DIAGNOSIS — R935 Abnormal findings on diagnostic imaging of other abdominal regions, including retroperitoneum: Secondary | ICD-10-CM

## 2023-09-26 DIAGNOSIS — E063 Autoimmune thyroiditis: Secondary | ICD-10-CM | POA: Diagnosis not present

## 2023-09-26 DIAGNOSIS — R7989 Other specified abnormal findings of blood chemistry: Secondary | ICD-10-CM | POA: Insufficient documentation

## 2023-09-26 DIAGNOSIS — E538 Deficiency of other specified B group vitamins: Secondary | ICD-10-CM | POA: Diagnosis not present

## 2023-09-26 LAB — LIPID PANEL
Cholesterol: 196 mg/dL (ref 0–200)
HDL: 52.6 mg/dL (ref >39.00)
LDL Cholesterol: 115 mg/dL — ABNORMAL HIGH (ref 0–99)
NonHDL: 143.66
Total CHOL/HDL Ratio: 4
Triglycerides: 143 mg/dL (ref 0.0–149.0)
VLDL: 28.6 mg/dL (ref 0.0–40.0)

## 2023-09-26 LAB — VITAMIN D 25 HYDROXY (VIT D DEFICIENCY, FRACTURES): VITD: 25.82 ng/mL — ABNORMAL LOW (ref 30.00–100.00)

## 2023-09-26 LAB — TSH: TSH: 13.54 u[IU]/mL — ABNORMAL HIGH (ref 0.35–5.50)

## 2023-09-26 MED ORDER — VITAMIN B-12 1000 MCG PO TABS
1000.0000 ug | ORAL_TABLET | Freq: Every day | ORAL | Status: AC
Start: 2023-09-26 — End: ?

## 2023-09-26 NOTE — Assessment & Plan Note (Signed)
 Intermittent pain Patient has been trying conservative measures without much improvement Referral placed for podiatry for eval treat

## 2023-09-26 NOTE — Assessment & Plan Note (Signed)
 Did advise patient not to take her levothyroxine with any vitamin supplementation patient will start taking B12 at least 4 hours from taking thyroid medication.  Repeating TSH today pending results

## 2023-09-26 NOTE — Assessment & Plan Note (Signed)
 Still ongoing with low positive ANA titer Patient has consult with rheumatology in 2 weeks to rule out autoimmune disease Also being considered arthritic

## 2023-09-26 NOTE — Progress Notes (Signed)
 Established Patient Office Visit  Subjective:   Patient ID: Angela Guerrero, female    DOB: 07/20/1986  Age: 38 y.o. MRN: [id number]  CC:  Chief Complaint  Patient presents with   Medical Management of Chronic Issues    HPI: Angela Guerrero is a 38 y.o. female presenting on 09/26/2023 for Medical Management of Chronic Issues  Hypothyroid: doing well on levothyroxine 125 mcg once daily.  She is taking the b12 with her thyroid medication.  Thyroid u/s 2023 with heterogenous texture consistent with thyroiditis  U/s transvaginal 07/08/23 with endometrial thickening with possible polyp or fibroid, saw gynecology. They are monitoring this for now. They have discussed nsaid for heaviness of periods. She will repeat u/s within the next one year.   Low positive ana, 1: 40 has a consult in two weeks with Dr. Dimple Casey.  Joint pain bil ankles, states today much improved and not as intense pain as it was back in November. Every once in a while will get the burning pain but not building up to where she was not able to walk and or complete her job duties. She does note some slight joint stiffness in the ankles, right greater than left. She was seen by Dr. Patsy Lager in evaluation of her right ankle, discussed trial pamelor pt states she did not take as the pain has improved. She will still consider if it comes back.   Right great toebunion, intermittent pain. When she doesn't wear crocs she has pain that is aggravated for days with redness. She has been dealing with this for a few years.  She would like a referral to podiatry.     ROS: Negative unless specifically indicated above in HPI.   Relevant past medical history reviewed and updated as indicated.   Allergies and medications reviewed and updated.   Current Outpatient Medications:    cyanocobalamin (VITAMIN B12) 1000 MCG tablet, Take 1 tablet (1,000 mcg total) by mouth daily., Disp: , Rfl:    naproxen (NAPROSYN) 500 MG tablet, Take 1 tablet (500 mg  total) by mouth 2 (two) times daily with a meal. Start 48-72hrs prior to onset of menses and take regularly throughout menses., Disp: 30 tablet, Rfl: 2   SYNTHROID 125 MCG tablet, Take one po every day for six days, don't take on day 7, Disp: , Rfl:   Allergies  Allergen Reactions   Percocet [Oxycodone-Acetaminophen] Diarrhea    Objective:   BP 110/74 (BP Location: Left Arm, Patient Position: Sitting)   Pulse 76   Temp 98.4 F (36.9 C) (Temporal)   Ht 5\' 2"  (1.575 m)   Wt 182 lb (82.6 kg)   LMP 09/04/2023   SpO2 98%   BMI 33.29 kg/m    Physical Exam Constitutional:      General: She is not in acute distress.    Appearance: Normal appearance. She is normal weight. She is not ill-appearing, toxic-appearing or diaphoretic.  HENT:     Head: Normocephalic.  Neck:     Thyroid: No thyromegaly or thyroid tenderness.  Cardiovascular:     Rate and Rhythm: Normal rate and regular rhythm.  Pulmonary:     Effort: Pulmonary effort is normal.     Breath sounds: Normal breath sounds.  Musculoskeletal:        General: Normal range of motion.  Neurological:     General: No focal deficit present.     Mental Status: She is alert and oriented to person, place, and time. Mental status is at baseline.  Psychiatric:        Mood and Affect: Mood normal.        Behavior: Behavior normal.        Thought Content: Thought content normal.        Judgment: Judgment normal.     Assessment & Plan:  Hypothyroidism due to Hashimoto thyroiditis Assessment & Plan: Did advise patient not to take her levothyroxine with any vitamin supplementation patient will start taking B12 at least 4 hours from taking thyroid medication.  Repeating TSH today pending results  Orders: -     TSH  Low serum vitamin B12 -     Vitamin B-12; Take 1 tablet (1,000 mcg total) by mouth daily.  Elevated LDL cholesterol level Assessment & Plan: Repeat cholesterol level today pending results  Orders: -     Lipid  panel  Low serum vitamin D -     VITAMIN D 25 Hydroxy (Vit-D Deficiency, Fractures); Future  Bunion of great toe of right foot Assessment & Plan: Intermittent pain Patient has been trying conservative measures without much improvement Referral placed for podiatry for eval treat  Orders: -     Ambulatory referral to Podiatry  Abnormal ultrasound of uterus Assessment & Plan: Following with gynecology   Polyarthritis Assessment & Plan: Still ongoing with low positive ANA titer Patient has consult with rheumatology in 2 weeks to rule out autoimmune disease Also being considered arthritic      Follow up plan: Return in about 6 months (around 03/25/2024) for f/u CPE.  Mort Sawyers, FNP

## 2023-09-26 NOTE — Patient Instructions (Signed)
 A referral was placed today podiatry.  Please let us know if you have not heard back within 2 weeks about the referral.

## 2023-09-26 NOTE — Assessment & Plan Note (Signed)
Following with gynecology

## 2023-09-26 NOTE — Assessment & Plan Note (Signed)
 Repeat cholesterol level today pending results

## 2023-09-27 ENCOUNTER — Other Ambulatory Visit: Payer: Self-pay | Admitting: Family

## 2023-09-27 ENCOUNTER — Encounter: Payer: Self-pay | Admitting: Family

## 2023-09-27 DIAGNOSIS — E559 Vitamin D deficiency, unspecified: Secondary | ICD-10-CM

## 2023-09-27 DIAGNOSIS — E063 Autoimmune thyroiditis: Secondary | ICD-10-CM

## 2023-09-27 MED ORDER — CHOLECALCIFEROL 1.25 MG (50000 UT) PO TABS: 1.0000 | ORAL_TABLET | ORAL | 0 refills | Status: AC

## 2023-09-27 MED ORDER — LEVOTHYROXINE SODIUM 112 MCG PO TABS: 112.0000 ug | ORAL_TABLET | Freq: Every day | ORAL | 3 refills | Status: AC

## 2023-10-12 ENCOUNTER — Ambulatory Visit (INDEPENDENT_AMBULATORY_CARE_PROVIDER_SITE_OTHER)

## 2023-10-12 ENCOUNTER — Ambulatory Visit: Payer: BC Managed Care – PPO | Admitting: Podiatry

## 2023-10-12 ENCOUNTER — Encounter: Payer: Self-pay | Admitting: Podiatry

## 2023-10-12 DIAGNOSIS — D2371 Other benign neoplasm of skin of right lower limb, including hip: Secondary | ICD-10-CM

## 2023-10-12 DIAGNOSIS — M21621 Bunionette of right foot: Secondary | ICD-10-CM | POA: Diagnosis not present

## 2023-10-12 DIAGNOSIS — M7751 Other enthesopathy of right foot: Secondary | ICD-10-CM

## 2023-10-12 MED ORDER — DEXAMETHASONE SODIUM PHOSPHATE 120 MG/30ML IJ SOLN
2.0000 mg | Freq: Once | INTRAMUSCULAR | Status: AC
  Administered 2023-10-12: 2 mg via INTRA_ARTICULAR

## 2023-10-12 NOTE — Progress Notes (Signed)
  Subjective:  Patient ID: Angela Guerrero, female    DOB: Jun 23, 1986,  MRN: [id number] HPI Chief Complaint  Patient presents with   Foot Pain    5th MPJ right - aching x 3 years, shoes aggravate daily, tries to wear crocs which are wide enough not to irritate, red and swollen some days   New Patient (Initial Visit)    38 y.o. female presents with the above complaint.   ROS: Denies fever chills nausea vomit muscle aches pains calf pain back pain chest pain shortness of breath.  Past Medical History:  Diagnosis Date   Hashimoto's thyroiditis    Past Surgical History:  Procedure Laterality Date   WISDOM TOOTH EXTRACTION      Current Outpatient Medications:    Cholecalciferol 1.25 MG (50000 UT) TABS, Take 1 tablet by mouth once a week., Disp: 12 tablet, Rfl: 0   cyanocobalamin (VITAMIN B12) 1000 MCG tablet, Take 1 tablet (1,000 mcg total) by mouth daily., Disp: , Rfl:    levothyroxine (SYNTHROID) 112 MCG tablet, Take 1 tablet (112 mcg total) by mouth daily., Disp: 90 tablet, Rfl: 3   naproxen (NAPROSYN) 500 MG tablet, Take 1 tablet (500 mg total) by mouth 2 (two) times daily with a meal. Start 48-72hrs prior to onset of menses and take regularly throughout menses., Disp: 30 tablet, Rfl: 2  Allergies  Allergen Reactions   Percocet [Oxycodone-Acetaminophen] Diarrhea   Review of Systems Objective:  There were no vitals filed for this visit.  General: Well developed, nourished, in no acute distress, alert and oriented x3   Dermatological: Skin is warm, dry and supple bilateral. Nails x 10 are well maintained; remaining integument appears unremarkable at this time. There are no open sores, no preulcerative lesions, no rash or signs of infection present.  Palpable bursa with mild erythema plantar and lateral aspect of the fifth metatarsal head of the right foot with benign skin lesion.  Vascular: Dorsalis Pedis artery and Posterior Tibial artery pedal pulses are 2/4 bilateral with  immedate capillary fill time. Pedal hair growth present. No varicosities and no lower extremity edema present bilateral.   Neruologic: Grossly intact via light touch bilateral. Vibratory intact via tuning fork bilateral. Protective threshold with Semmes Wienstein monofilament intact to all pedal sites bilateral. Patellar and Achilles deep tendon reflexes 2+ bilateral. No Babinski or clonus noted bilateral.   Musculoskeletal: No gross boney pedal deformities bilateral. No pain, crepitus, or limitation noted with foot and ankle range of motion bilateral. Muscular strength 5/5 in all groups tested bilateral.  Tailor's bunion deformity right foot.  Gait: Unassisted, Nonantalgic.    Radiographs:  Radiographs taken today demonstrate osseously mature individual mild metatarsus adductus tailor's bunion deformity is visible.  No significant acute abnormalities.  Soft tissue swelling lateral aspect of the fifth metatarsal head right  Assessment & Plan:   Assessment: Tailor's bunion deformity.  Bursitis fifth metatarsal phalangeal joint.  Benign skin lesion fifth metatarsal phalangeal joint.  Plan: I injected 2 mg of dexamethasone and local anesthetic to the bursa today.  I debrided the benign skin lesion.  Discussed appropriate shoe gear and with and the possible need for surgery.  I will follow-up with her on an as-needed basis.     Kaitlyn Franko T. [place], [place]

## 2023-10-18 NOTE — Progress Notes (Unsigned)
 Office Visit Note  Patient: Angela Guerrero             Date of Birth: 1986/05/08           MRN: [id number]             PCP: Mort Sawyers, FNP Referring: Mort Sawyers, FNP Visit Date: 10/19/2023   Subjective:  No chief complaint on file.   History of Present Illness: Angela Guerrero is a 38 y.o. female here for follow up ***   Previous HPI    No Rheumatology ROS completed.   PMFS History:  Patient Active Problem List   Diagnosis Date Noted   Elevated LDL cholesterol level 09/26/2023   Low serum vitamin B12 09/26/2023   Bunion of great toe of right foot 09/26/2023   Low serum vitamin D 09/26/2023   Abnormal ultrasound of uterus 07/10/2023   Menorrhagia with regular cycle 06/22/2023   Polyarthritis 06/22/2023   Ankle joint stiffness, bilateral 05/10/2023   Raynaud's phenomenon without gangrene 05/10/2023   Multinodular goiter 01/01/2019   Allergic rhinitis 09/28/2016   Hashimoto's thyroiditis 06/29/2016   Constipation 01/28/2015   Vitamin D deficiency 09/09/2014   Hypothyroidism 12/15/2011    Past Medical History:  Diagnosis Date   Hashimoto's thyroiditis     Family History  Problem Relation Age of Onset   Early death Mother    Alcohol abuse Mother    Depression Mother        died age 66   Anxiety disorder Mother    Mental illness Mother    Hypertension Father    Hyperlipidemia Father    Early death Father    COPD Father    Alcohol abuse Father    Heart disease Father    Heart attack Father        died at age 27   Bipolar disorder Brother    Drug abuse Brother    Polycystic kidney disease Brother    Pulmonary Hypertension Brother    Hypertension Brother    Hypertension Maternal Grandmother    Diabetes Maternal Grandmother    Pancreatic cancer Maternal Grandmother    Hyperlipidemia Maternal Grandfather    Heart disease Maternal Grandfather    Hypertension Maternal Grandfather    Melanoma Maternal Grandfather    COPD Maternal Grandfather     Hypertension Paternal Grandmother    Hyperlipidemia Paternal Grandmother    Heart disease Paternal Grandmother    Hypothyroidism Paternal Grandmother    Hypertension Paternal Grandfather    Hyperlipidemia Paternal Grandfather    Early death Paternal Grandfather    Heart disease Paternal Grandfather    Hypothyroidism Half-Sister    Past Surgical History:  Procedure Laterality Date   WISDOM TOOTH EXTRACTION     Social History   Social History Narrative   27  y/o daughter    Son is 14   Immunization History  Administered Date(s) Administered   DTaP, 5 pertussis antigens 01/11/1986, 05/10/1986, 08/23/1986, 01/14/1988, 02/19/1991   Hep A, Unspecified 12/02/2008, 06/30/2009   Hep B, Unspecified 05/01/1997, 07/03/1997, 10/10/1997   Hepatitis A, Adult 12/02/2008, 06/30/2009   Hepatitis B, PED/ADOLESCENT 05/01/1997, 07/03/1997, 10/10/1997   Hpv-Unspecified 12/02/2008, 02/04/2009, 06/30/2009   IPV 01/11/1986, 05/10/1986, 08/23/1986, 01/14/1988, 02/19/1991   Influenza Split 05/14/2020   Influenza,inj,Quad PF,6+ Mos 06/03/2022   Influenza-Unspecified 07/23/2008, 06/30/2009, 05/03/2016   MMR 03/14/1987, 03/01/1991   PPD Test 08/16/2018   Td 05/16/2018   Td (Adult),5 Lf Tetanus Toxid, Preservative Free 05/16/2018   Td (Adult),unspecified 12/05/2007  Tdap 12/05/2007     Objective: Vital Signs: LMP 09/04/2023    Physical Exam   Musculoskeletal Exam: ***  CDAI Exam: CDAI Score: -- Patient Global: --; Provider Global: -- Swollen: --; Tender: -- Joint Exam 10/19/2023   No joint exam has been documented for this visit   There is currently no information documented on the homunculus. Go to the Rheumatology activity and complete the homunculus joint exam.  Investigation: No additional findings.  Imaging: DG Foot Complete Right Result Date: 10/12/2023 Please see detailed radiograph report in office note.   Recent Labs: Lab Results  Component Value Date   WBC 7.0  05/10/2023   HGB 12.6 05/10/2023   PLT 379.0 05/10/2023   NA 138 06/03/2022   K 4.7 06/03/2022   CL 102 06/03/2022   CO2 28 06/03/2022   GLUCOSE 90 06/03/2022   BUN 14 06/03/2022   CREATININE 0.65 06/03/2022   BILITOT 0.8 01/30/2021   ALKPHOS 37 (L) 01/30/2021   AST 18 01/30/2021   ALT 9 01/30/2021   PROT 7.9 01/30/2021   ALBUMIN 4.5 01/30/2021   CALCIUM 9.4 06/03/2022    Speciality Comments: No specialty comments available.  Procedures:  No procedures performed Allergies: Percocet [oxycodone-acetaminophen]   Assessment / Plan:     Visit Diagnoses: No diagnosis found.  ***  Orders: No orders of the defined types were placed in this encounter.  No orders of the defined types were placed in this encounter.    Follow-Up Instructions: No follow-ups on file.   Fuller Plan, MD  Note - This record has been created using [employer].  Chart creation errors have been sought, but may not always  have been located. Such creation errors do not reflect on  the standard of medical care.

## 2023-10-19 ENCOUNTER — Ambulatory Visit: Payer: BC Managed Care – PPO | Attending: Internal Medicine | Admitting: Internal Medicine

## 2023-10-19 ENCOUNTER — Encounter: Payer: Self-pay | Admitting: Internal Medicine

## 2023-10-19 VITALS — BP 127/84 | HR 72 | Resp 14 | Ht 63.75 in | Wt 181.0 lb

## 2023-10-19 DIAGNOSIS — E063 Autoimmune thyroiditis: Secondary | ICD-10-CM | POA: Diagnosis not present

## 2023-10-19 DIAGNOSIS — I7300 Raynaud's syndrome without gangrene: Secondary | ICD-10-CM

## 2023-10-19 DIAGNOSIS — R768 Other specified abnormal immunological findings in serum: Secondary | ICD-10-CM

## 2023-10-19 DIAGNOSIS — M21611 Bunion of right foot: Secondary | ICD-10-CM

## 2023-10-19 NOTE — Patient Instructions (Addendum)
 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 The main treatment would be consistent use of UV protection when outside. Also continue daily vitamin D supplements.

## 2023-10-21 LAB — SJOGRENS SYNDROME-A EXTRACTABLE NUCLEAR ANTIBODY: SSA (Ro) (ENA) Antibody, IgG: <1 AI

## 2023-10-21 LAB — SJOGRENS SYNDROME-B EXTRACTABLE NUCLEAR ANTIBODY: SSB (La) (ENA) Antibody, IgG: <1 AI

## 2023-10-21 LAB — ANTI-DNA ANTIBODY, DOUBLE-STRANDED: ds DNA Ab: 9 [IU]/mL — ABNORMAL HIGH

## 2023-10-21 LAB — RNP ANTIBODY: Ribonucleic Protein(ENA) Antibody, IgG: <1 AI

## 2023-10-21 LAB — C3 AND C4
C3 Complement: 120 mg/dL (ref 83–193)
C4 Complement: 19 mg/dL (ref 15–57)

## 2023-10-21 LAB — ANTI-SMITH ANTIBODY: ENA SM Ab Ser-aCnc: <1 AI

## 2023-10-31 ENCOUNTER — Encounter: Payer: Self-pay | Admitting: Family

## 2023-10-31 ENCOUNTER — Other Ambulatory Visit (INDEPENDENT_AMBULATORY_CARE_PROVIDER_SITE_OTHER)

## 2023-10-31 DIAGNOSIS — E538 Deficiency of other specified B group vitamins: Secondary | ICD-10-CM

## 2023-10-31 DIAGNOSIS — E063 Autoimmune thyroiditis: Secondary | ICD-10-CM

## 2023-10-31 DIAGNOSIS — R7989 Other specified abnormal findings of blood chemistry: Secondary | ICD-10-CM

## 2023-10-31 LAB — TSH: TSH: 3.72 u[IU]/mL (ref 0.35–5.50)

## 2023-12-02 ENCOUNTER — Other Ambulatory Visit (INDEPENDENT_AMBULATORY_CARE_PROVIDER_SITE_OTHER)

## 2023-12-02 DIAGNOSIS — R7989 Other specified abnormal findings of blood chemistry: Secondary | ICD-10-CM

## 2023-12-02 DIAGNOSIS — E063 Autoimmune thyroiditis: Secondary | ICD-10-CM

## 2023-12-02 DIAGNOSIS — E538 Deficiency of other specified B group vitamins: Secondary | ICD-10-CM | POA: Diagnosis not present

## 2023-12-02 LAB — VITAMIN D 25 HYDROXY (VIT D DEFICIENCY, FRACTURES): VITD: 41.78 ng/mL (ref 30.00–100.00)

## 2023-12-02 LAB — TSH: TSH: 1.03 u[IU]/mL (ref 0.35–5.50)

## 2023-12-02 LAB — VITAMIN B12: Vitamin B-12: 538 pg/mL (ref 211–911)

## 2023-12-05 ENCOUNTER — Encounter: Payer: Self-pay | Admitting: Family

## 2024-06-29 ENCOUNTER — Encounter: Payer: Self-pay | Admitting: Family

## 2024-06-29 DIAGNOSIS — Z1283 Encounter for screening for malignant neoplasm of skin: Secondary | ICD-10-CM

## 2025-02-13 ENCOUNTER — Ambulatory Visit: Admitting: Physician Assistant
